# Patient Record
Sex: Male | Born: 1975 | Race: Black or African American | Hispanic: No | Marital: Single | Smoking: Never smoker
Health system: Southern US, Community
[De-identification: ages and names within clinical notes are randomized; demographics above are authoritative.]

## PROBLEM LIST (undated history)

## (undated) DIAGNOSIS — N400 Enlarged prostate without lower urinary tract symptoms: Secondary | ICD-10-CM

## (undated) DIAGNOSIS — R0600 Dyspnea, unspecified: Secondary | ICD-10-CM

## (undated) DIAGNOSIS — K219 Gastro-esophageal reflux disease without esophagitis: Secondary | ICD-10-CM

## (undated) DIAGNOSIS — I429 Cardiomyopathy, unspecified: Secondary | ICD-10-CM

## (undated) HISTORY — DX: Benign prostatic hyperplasia without lower urinary tract symptoms: N40.0

## (undated) HISTORY — PX: CARPAL TUNNEL RELEASE: SHX101

---

## 2006-02-18 HISTORY — PX: TONSILLECTOMY AND ADENOIDECTOMY: SUR1326

## 2006-07-14 ENCOUNTER — Ambulatory Visit: Payer: Self-pay | Admitting: Otolaryngology

## 2009-11-09 ENCOUNTER — Ambulatory Visit: Payer: Self-pay | Admitting: Otolaryngology

## 2009-11-27 ENCOUNTER — Ambulatory Visit: Payer: Self-pay | Admitting: Otolaryngology

## 2010-07-12 ENCOUNTER — Ambulatory Visit: Payer: Self-pay | Admitting: Unknown Physician Specialty

## 2012-05-25 ENCOUNTER — Ambulatory Visit (INDEPENDENT_AMBULATORY_CARE_PROVIDER_SITE_OTHER): Payer: BC Managed Care – PPO | Admitting: Internal Medicine

## 2012-05-25 ENCOUNTER — Encounter: Payer: Self-pay | Admitting: Internal Medicine

## 2012-05-25 VITALS — BP 120/82 | HR 48 | Temp 98.0°F | Ht 66.0 in | Wt 213.0 lb

## 2012-05-25 DIAGNOSIS — Z0000 Encounter for general adult medical examination without abnormal findings: Secondary | ICD-10-CM

## 2012-05-25 DIAGNOSIS — N400 Enlarged prostate without lower urinary tract symptoms: Secondary | ICD-10-CM

## 2012-05-25 DIAGNOSIS — R209 Unspecified disturbances of skin sensation: Secondary | ICD-10-CM

## 2012-05-25 DIAGNOSIS — R002 Palpitations: Secondary | ICD-10-CM

## 2012-05-25 DIAGNOSIS — R200 Anesthesia of skin: Secondary | ICD-10-CM

## 2012-05-25 LAB — LIPID PANEL
Cholesterol: 170 mg/dL (ref 0–200)
HDL: 38.8 mg/dL — ABNORMAL LOW (ref >39.00)
LDL Cholesterol: 119 mg/dL — ABNORMAL HIGH (ref 0–99)
Total CHOL/HDL Ratio: 4
Triglycerides: 59 mg/dL (ref 0.0–149.0)
VLDL: 11.8 mg/dL (ref 0.0–40.0)

## 2012-05-25 LAB — CBC WITH DIFFERENTIAL/PLATELET
Basophils Absolute: 0 10*3/uL (ref 0.0–0.1)
Basophils Relative: 0.2 % (ref 0.0–3.0)
Eosinophils Absolute: 0.3 10*3/uL (ref 0.0–0.7)
Eosinophils Relative: 3.8 % (ref 0.0–5.0)
HCT: 42.9 % (ref 39.0–52.0)
Hemoglobin: 14.5 g/dL (ref 13.0–17.0)
Lymphocytes Relative: 27.9 % (ref 12.0–46.0)
Lymphs Abs: 2.5 10*3/uL (ref 0.7–4.0)
MCHC: 33.8 g/dL (ref 30.0–36.0)
MCV: 84.7 fl (ref 78.0–100.0)
Monocytes Absolute: 0.5 10*3/uL (ref 0.1–1.0)
Monocytes Relative: 5.6 % (ref 3.0–12.0)
Neutro Abs: 5.7 10*3/uL (ref 1.4–7.7)
Neutrophils Relative %: 62.5 % (ref 43.0–77.0)
Platelets: 219 10*3/uL (ref 150.0–400.0)
RBC: 5.07 Mil/uL (ref 4.22–5.81)
RDW: 12.9 % (ref 11.5–14.6)
WBC: 9 10*3/uL (ref 4.5–10.5)

## 2012-05-25 LAB — COMPREHENSIVE METABOLIC PANEL
ALT: 33 U/L (ref 0–53)
AST: 20 U/L (ref 0–37)
Albumin: 3.9 g/dL (ref 3.5–5.2)
Alkaline Phosphatase: 45 U/L (ref 39–117)
BUN: 11 mg/dL (ref 6–23)
CO2: 30 mEq/L (ref 19–32)
Calcium: 9 mg/dL (ref 8.4–10.5)
Chloride: 105 mEq/L (ref 96–112)
Creatinine, Ser: 1 mg/dL (ref 0.4–1.5)
GFR: 105.61 mL/min (ref >60.00)
Glucose, Bld: 89 mg/dL (ref 70–99)
Potassium: 4.1 mEq/L (ref 3.5–5.1)
Sodium: 142 mEq/L (ref 135–145)
Total Bilirubin: 0.7 mg/dL (ref 0.3–1.2)
Total Protein: 7.1 g/dL (ref 6.0–8.3)

## 2012-05-25 LAB — TSH: TSH: 3.01 u[IU]/mL (ref 0.35–5.50)

## 2012-05-25 MED ORDER — SILODOSIN 4 MG PO CAPS: 8.0000 mg | ORAL_CAPSULE | Freq: Every day | ORAL | Status: DC

## 2012-05-25 NOTE — Assessment & Plan Note (Addendum)
Bilateral arm numbness, generally radiating from hands up to shoulders. Exam is normal today. Previous Dx of carpal tunnel s/p median nerve release right hand.  However, now having bilateral symptoms. Question if he may have cervical radiculopathy causing symptoms. Insurance will not allow for MRI cervical spine without course of physical therapy or neurology evaluation. Will set up neurology evaluation. EMG testing would be helpful to distinguish source of numbness.

## 2012-05-25 NOTE — Assessment & Plan Note (Signed)
H/o enlarged prostate. Will request notes from urology. Will check PSA today. Continue Rapaflo prn.

## 2012-05-25 NOTE — Assessment & Plan Note (Signed)
Occasional palpitations. No symptoms to suggest cardiac ischemia. EKG shows bradycardia. Electrolytes, thyroid function normal on labs. Will continue to monitor. Pt will call if symptoms are worsening.

## 2012-05-25 NOTE — Progress Notes (Signed)
Subjective:    Patient ID: Gary Pace, male    DOB: Jun 07, 1975, 37 y.o.   MRN: [id number]  HPI 37 year old male with enlarged prostate and bilateral carpal tunnel syndrome presents to establish care. Patient is concerned about numbness in both of his hands. This is been ongoing for years. He was previously diagnosed with carpal tunnel and underwent median nerve release in his right hand. He had some improvement with this however over the last couple of months symptoms of numbness in both of his hands has worsened. He now has bilateral hand numbness which radiates up his arms and shoulders. This is made worse by extending his arms over his head. It is not generally affected at night. He denies pain in his neck or upper back. He denies any history of trauma to his neck or upper back. He denies weakness in his arms.  He is also concerned about occasional palpitations. These are described as a "fluttering" which is not associated with any chest pain, shortness of breath, diaphoresis. He is physically very active in his job and denies any shortness of breath or chest pain with exertion. These episodes have been fleeting and rare. It lasts for a few seconds and then resolve without any intervention.  He notes a history of enlarged prostate. He occasionally takes Rapaflo with improvement in UOP. He is followed by urology. He reports that previous PSA testing was normal. He has had a history of prostatitis in the past. No current symptoms of dysuria, pain.  Outpatient Encounter Prescriptions as of 05/25/2012  Medication Sig Dispense Refill  . silodosin (RAPAFLO) 4 MG CAPS capsule Take 2 capsules (8 mg total) by mouth daily with breakfast.  60 capsule  1   No facility-administered encounter medications on file as of 05/25/2012.   BP 120/82  Pulse 48  Temp(Src) 98 F (36.7 C) (Oral)  Ht 5\' 6"  (1.676 m)  Wt 213 lb (96.616 kg)  BMI 34.4 kg/m2  SpO2 97%  Review of Systems  Constitutional: Negative for  fever, chills, activity change, appetite change, fatigue and unexpected weight change.  HENT: Positive for congestion (occasional). Negative for sneezing, neck pain, neck stiffness, postnasal drip and sinus pressure.   Eyes: Negative for visual disturbance.  Respiratory: Negative for cough and shortness of breath.   Cardiovascular: Positive for palpitations. Negative for chest pain and leg swelling.  Gastrointestinal: Negative for abdominal pain and abdominal distention.  Genitourinary: Negative for dysuria, urgency and difficulty urinating.  Musculoskeletal: Negative for back pain, arthralgias and gait problem.  Skin: Negative for color change and rash.  Neurological: Positive for numbness. Negative for dizziness, weakness and headaches.  Hematological: Negative for adenopathy.  Psychiatric/Behavioral: Negative for sleep disturbance and dysphoric mood. The patient is not nervous/anxious.        Objective:   Physical Exam  Constitutional: He is oriented to person, place, and time. He appears well-developed and well-nourished. No distress.  HENT:  Head: Normocephalic and atraumatic.  Right Ear: External ear normal.  Left Ear: External ear normal.  Nose: Nose normal.  Mouth/Throat: Oropharynx is clear and moist. No oropharyngeal exudate.  Eyes: Conjunctivae and EOM are normal. Pupils are equal, round, and reactive to light. Right eye exhibits no discharge. Left eye exhibits no discharge. No scleral icterus.  Neck: Normal range of motion. Neck supple. No tracheal deviation present. No thyromegaly present.  Cardiovascular: Normal rate, regular rhythm and normal heart sounds.  Exam reveals no gallop and no friction rub.   No murmur heard.  Pulmonary/Chest: Effort normal and breath sounds normal. No accessory muscle usage. Not tachypneic. No respiratory distress. He has no decreased breath sounds. He has no wheezes. He has no rhonchi. He has no rales. He exhibits no tenderness.  Abdominal:  Soft. Bowel sounds are normal. He exhibits no distension and no mass. There is no tenderness. There is no rebound and no guarding.  Musculoskeletal: Normal range of motion. He exhibits no edema.  Lymphadenopathy:    He has no cervical adenopathy.  Neurological: He is alert and oriented to person, place, and time. He displays no atrophy and no tremor. No cranial nerve deficit or sensory deficit. He exhibits normal muscle tone. Coordination and gait normal.  Reflex Scores:      Patellar reflexes are 2+ on the right side and 2+ on the left side. Skin: Skin is warm and dry. No rash noted. He is not diaphoretic. No erythema. No pallor.  Psychiatric: He has a normal mood and affect. His behavior is normal. Judgment and thought content normal.          Assessment & Plan:

## 2012-05-26 ENCOUNTER — Encounter: Payer: Self-pay | Admitting: *Deleted

## 2012-05-26 ENCOUNTER — Encounter: Payer: Self-pay | Admitting: Emergency Medicine

## 2012-05-26 LAB — PSA, TOTAL AND FREE
PSA, Free Pct: 43 % (ref >25)
PSA, Free: 0.1 ng/mL
PSA: 0.23 ng/mL (ref <=4.00)

## 2012-05-29 ENCOUNTER — Telehealth: Payer: Self-pay | Admitting: Internal Medicine

## 2012-05-29 NOTE — Telephone Encounter (Signed)
Asking for results °

## 2012-05-29 NOTE — Telephone Encounter (Signed)
Asking that you call Jan Mehring at work:  [phone].

## 2012-06-01 NOTE — Telephone Encounter (Signed)
Spoke with patient on Friday, he called before he received letter in mail. Result letter was mailed and was received on Friday.

## 2012-06-29 ENCOUNTER — Ambulatory Visit: Payer: BC Managed Care – PPO | Admitting: Internal Medicine

## 2013-04-22 ENCOUNTER — Encounter: Payer: Self-pay | Admitting: Adult Health

## 2013-04-22 ENCOUNTER — Ambulatory Visit (INDEPENDENT_AMBULATORY_CARE_PROVIDER_SITE_OTHER): Payer: BC Managed Care – PPO | Admitting: Adult Health

## 2013-04-22 VITALS — BP 108/66 | HR 64 | Temp 98.1°F | Resp 16 | Wt 226.0 lb

## 2013-04-22 DIAGNOSIS — J019 Acute sinusitis, unspecified: Secondary | ICD-10-CM

## 2013-04-22 MED ORDER — AMOXICILLIN-POT CLAVULANATE 875-125 MG PO TABS: 1.0000 | ORAL_TABLET | Freq: Two times a day (BID) | ORAL | Status: DC

## 2013-04-22 NOTE — Patient Instructions (Signed)
  Start Augmentin 1 tablet twice a day for 7 days.  Use Dymista nasal spray - 1 spray into each nostril twice daily  You may irrigate sinuses once daily with either a nettie pot of simple saline.  Cool mist humidifier will also help get sinuses moist and prevent from over drying.  Please call if no improvement in your symptoms within 4-5 days.

## 2013-04-22 NOTE — Progress Notes (Signed)
Pre visit review using our clinic review tool, if applicable. No additional management support is needed unless otherwise documented below in the visit note. 

## 2013-04-22 NOTE — Progress Notes (Signed)
   Subjective:    Patient ID: Gary Pace, male    DOB: [birth year], 38 y.o.   MRN: [id number]  HPI  Pt is a pleasant 38 y/o male who presents to clinic with > 2 week symptoms of sinus pressure, HA, yellow secretions, post nasal drip. Denies cough, sob, fever or chills. He has tried OTC Dayquil and/or Nyquil without noticeable improvement.  Current Outpatient Prescriptions on File Prior to Visit  Medication Sig Dispense Refill  . silodosin (RAPAFLO) 4 MG CAPS capsule Take 2 capsules (8 mg total) by mouth daily with breakfast.  60 capsule  1   No current facility-administered medications on file prior to visit.    Review of Systems  Constitutional: Negative for fever and chills.  HENT: Positive for postnasal drip and sinus pressure.   Respiratory: Negative.   Cardiovascular: Negative.   Neurological: Positive for headaches. Negative for dizziness.       Objective:   Physical Exam  Constitutional: He is oriented to person, place, and time. He appears well-developed and well-nourished. No distress.  HENT:  Nose: Nose normal.  Pharyngeal erythema  Cardiovascular: Normal rate, regular rhythm and normal heart sounds.   Pulmonary/Chest: Effort normal and breath sounds normal. No respiratory distress. He has no wheezes. He has no rales.  Musculoskeletal: Normal range of motion.  Lymphadenopathy:    He has no cervical adenopathy.  Neurological: He is alert and oriented to person, place, and time.  Psychiatric: He has a normal mood and affect. His behavior is normal. Judgment and thought content normal.       Assessment & Plan:    1. Acute sinusitis with symptoms > 10 days Irrigate sinus qd. Dymista spray. Cool mist humidifier. Start Augmentin bid x 7 days

## 2013-09-17 ENCOUNTER — Encounter: Payer: Self-pay | Admitting: Adult Health

## 2013-09-17 ENCOUNTER — Ambulatory Visit (INDEPENDENT_AMBULATORY_CARE_PROVIDER_SITE_OTHER): Payer: BC Managed Care – PPO | Admitting: Adult Health

## 2013-09-17 VITALS — BP 121/80 | HR 53 | Temp 98.0°F | Resp 14 | Wt 219.0 lb

## 2013-09-17 DIAGNOSIS — J019 Acute sinusitis, unspecified: Secondary | ICD-10-CM

## 2013-09-17 MED ORDER — AMOXICILLIN-POT CLAVULANATE 875-125 MG PO TABS: 1.0000 | ORAL_TABLET | Freq: Two times a day (BID) | ORAL | Status: DC

## 2013-09-17 NOTE — Progress Notes (Signed)
Patient ID: Gary HalJames B Bermea, male   DOB: [birth year], 38 y.o.   MRN: [id number]   Subjective:    Patient ID: Gary Pace, male    DOB: [birth year], 38 y.o.   MRN: [id number]  HPI  Patient is a pleasant 38 year old male who presents to clinic with sinus congestion, postnasal drip, cough that has been ongoing for 2-3 weeks. He has been using Flonase and taking Claritin D. occasionally. He reports that his symptoms have not improved. Denies fever, chills. He is having mild frontal headaches.  Past Medical History  Diagnosis Date  . Enlarged prostate     Followed by Dr. Achilles Dunkope    No current outpatient prescriptions on file prior to visit.   No current facility-administered medications on file prior to visit.     Review of Systems  Constitutional: Negative for fever and chills.  HENT: Positive for congestion, postnasal drip and sinus pressure. Negative for ear discharge, rhinorrhea and sore throat.   Respiratory: Positive for cough. Negative for shortness of breath and wheezing.   Gastrointestinal: Negative.   Neurological: Positive for headaches.  All other systems reviewed and are negative.      Objective:  BP 121/80  Pulse 53  Temp(Src) 98 F (36.7 C) (Oral)  Resp 14  Wt 219 lb (99.338 kg)  SpO2 97%   Physical Exam  Constitutional: He is oriented to person, place, and time. He appears well-developed and well-nourished. No distress.  HENT:  Head: Normocephalic and atraumatic.  Mouth/Throat: No oropharyngeal exudate.  Eyes: Conjunctivae and EOM are normal.  Neck: Normal range of motion. Neck supple.  Cardiovascular: Normal rate, regular rhythm and normal heart sounds.  Exam reveals no gallop and no friction rub.   No murmur heard. Pulmonary/Chest: Effort normal and breath sounds normal. No respiratory distress. He has no wheezes. He has no rales.  Musculoskeletal: Normal range of motion.  Lymphadenopathy:    He has no cervical adenopathy.  Neurological: He is alert and  oriented to person, place, and time.  Skin: Skin is warm and dry.  Psychiatric: He has a normal mood and affect. His behavior is normal. Judgment and thought content normal.      Assessment & Plan:   1. Acute sinusitis with symptoms > 10 days Start Augmentin twice a day x7 days. Return to clinic if symptoms are not improved within 4-5 days.

## 2013-09-17 NOTE — Patient Instructions (Signed)
  Start Augmentin 1 tablet twice a day for 7 days.  Continue antihistamine such as Claritin, Allegra or Zyrtec.  Drink plenty of fluids.  Tylenol for general discomfort or fever.  Return to clinic if symptoms do not improve within 4-5 days.

## 2013-09-17 NOTE — Progress Notes (Signed)
Pre visit review using our clinic review tool, if applicable. No additional management support is needed unless otherwise documented below in the visit note. 

## 2014-07-11 ENCOUNTER — Encounter: Payer: Self-pay | Admitting: Internal Medicine

## 2014-07-11 ENCOUNTER — Ambulatory Visit (INDEPENDENT_AMBULATORY_CARE_PROVIDER_SITE_OTHER): Payer: BLUE CROSS/BLUE SHIELD | Admitting: Internal Medicine

## 2014-07-11 VITALS — BP 110/78 | HR 52 | Temp 98.4°F | Resp 16 | Ht 66.0 in | Wt 214.2 lb

## 2014-07-11 DIAGNOSIS — J0120 Acute ethmoidal sinusitis, unspecified: Secondary | ICD-10-CM | POA: Diagnosis not present

## 2014-07-11 DIAGNOSIS — J019 Acute sinusitis, unspecified: Secondary | ICD-10-CM | POA: Diagnosis not present

## 2014-07-11 MED ORDER — BENZONATATE 200 MG PO CAPS: 200.0000 mg | ORAL_CAPSULE | Freq: Three times a day (TID) | ORAL | Status: DC | PRN

## 2014-07-11 MED ORDER — PREDNISONE 10 MG PO TABS: ORAL_TABLET | ORAL | Status: DC

## 2014-07-11 MED ORDER — AMOXICILLIN-POT CLAVULANATE 875-125 MG PO TABS: 1.0000 | ORAL_TABLET | Freq: Two times a day (BID) | ORAL | Status: DC

## 2014-07-11 NOTE — Progress Notes (Signed)
Subjective:  Patient ID: Gary Pace, male    DOB: 01/01/1976  Age: 39 y.o. MRN: [id number]  CC: The primary encounter diagnosis was Acute sinusitis with symptoms > 10 days. A diagnosis of Acute ethmoidal sinusitis, recurrence not specified was also pertinent to this visit.  HPI Gary Pace presents for sinus congestion purulent drainage x 4 weeks,  Headaches,  Cough.  Has been using over the counter medications without significant change.   Outpatient Prescriptions Prior to Visit  Medication Sig Dispense Refill  . silodosin (RAPAFLO) 4 MG CAPS capsule Take 8 mg by mouth as needed.    Marland Kitchen amoxicillin-clavulanate (AUGMENTIN) 875-125 MG per tablet Take 1 tablet by mouth 2 (two) times daily. (Patient not taking: Reported on 07/11/2014) 14 tablet 0   No facility-administered medications prior to visit.    Review of Systems;  Patient denies headache, fevers, malaise, unintentional weight loss, skin rash, eye pain,  pain, sore throat, dysphagia,  hemoptysis ,  dyspnea, wheezing, chest pain, palpitations, orthopnea, edema, abdominal pain, nausea, melena, diarrhea, constipation, flank pain, dysuria, hematuria, urinary  Frequency, nocturia, numbness, tingling, seizures,  Focal weakness, Loss of consciousness,  Tremor, insomnia, depression, anxiety, and suicidal ideation.      Objective:  BP 110/78 mmHg  Pulse 52  Temp(Src) 98.4 F (36.9 C) (Oral)  Resp 16  Ht  (1.676 m)  Wt 214 lb 4 oz (97.183 kg)  BMI 34.60 kg/m2  SpO2 97%  BP Readings from Last 3 Encounters:  07/11/14 110/78  09/17/13 121/80  04/22/13 108/66    Wt Readings from Last 3 Encounters:  07/11/14 214 lb 4 oz (97.183 kg)  09/17/13 219 lb (99.338 kg)  04/22/13 226 lb (102.513 kg)    General appearance: alert, cooperative and appears stated age Ears: normal TM's and external ear canals both ears Throat: lips, mucosa, and tongue normal; teeth and gums normal Neck: no adenopathy, no carotid bruit, supple,  symmetrical, trachea midline and thyroid not enlarged, symmetric, no tenderness/mass/nodules Lungs: clear to auscultation bilaterally Heart: regular rate and rhythm, S1, S2 normal, no murmur, click, rub or gallop Lymph nodes: Cervical, supraclavicular, and axillary nodes normal.   No results found.  Assessment & Plan:   Problem List Items Addressed This Visit    Acute sinusitis with symptoms > 10 days - Primary    Prednisone taper, augmentin, Probiotic, sinus rinse bid,  claritin and sudafed       Relevant Medications   amoxicillin-clavulanate (AUGMENTIN) 875-125 MG per tablet   predniSONE (DELTASONE) 10 MG tablet   benzonatate (TESSALON) 200 MG capsule   Sinusitis, acute   Relevant Medications   amoxicillin-clavulanate (AUGMENTIN) 875-125 MG per tablet   predniSONE (DELTASONE) 10 MG tablet   benzonatate (TESSALON) 200 MG capsule      I am having [name] start on predniSONE and benzonatate. I am also having him maintain his silodosin and amoxicillin-clavulanate.  Meds ordered this encounter  Medications  . amoxicillin-clavulanate (AUGMENTIN) 875-125 MG per tablet    Sig: Take 1 tablet by mouth 2 (two) times daily.    Dispense:  14 tablet    Refill:  0  . predniSONE (DELTASONE) 10 MG tablet    Sig: 6 tablets on Day 1 , then reduce by 1 tablet daily until gone    Dispense:  21 tablet    Refill:  0  . benzonatate (TESSALON) 200 MG capsule    Sig: Take 1 capsule (200 mg total) by mouth 3 (three) times  daily as needed for cough.    Dispense:  60 capsule    Refill:  1    Medications Discontinued During This Encounter  Medication Reason  . amoxicillin-clavulanate (AUGMENTIN) 875-125 MG per tablet Reorder    Follow-up: No Follow-up on file.   Sherlene ShamsULLO, Roshaunda Starkey L, MD

## 2014-07-11 NOTE — Assessment & Plan Note (Addendum)
Prednisone taper, augmentin, Probiotic, sinus rinse bid,  claritin and sudafed

## 2014-07-11 NOTE — Patient Instructions (Addendum)
I am treating you for sinusitis/otitis which is a complication from  persistent sinus congestion.   I am prescribing an antibiotic (augmentin) and a prednisone taper  To manage the infection and the inflammation in your ear/sinuses.   The prednisone tablets should be taken as follows:  6 tablets all at once on Day 1.  Then 5 tablets all at once on Day 2,  Then 4 tablets on day 3, etc   I also advise use of the following OTC meds to help with your other symptoms.   Take generic OTC benadryl 25 mg every 8 hours for the drainage,  Sudafed PE  10 to 30 mg every 8 hours for the congestion, you may substitute Afrin nasal spray for the nighttime dose of sudafed PE  If needed to prevent insomnia.  flush your sinuses twice daily with NeilMed sinus rinse (do over the sink because if you do it right you will spit out globs of mucus)  I recommend continuing claritin and sinus rinse for the rest of the summer  You can add the cough capsules 3 times daily if needed,  If Delsym is not strong enough

## 2014-07-11 NOTE — Progress Notes (Signed)
Pre-visit discussion using our clinic review tool. No additional management support is needed unless otherwise documented below in the visit note.  

## 2014-07-12 DIAGNOSIS — J019 Acute sinusitis, unspecified: Secondary | ICD-10-CM | POA: Insufficient documentation

## 2014-10-27 ENCOUNTER — Ambulatory Visit (INDEPENDENT_AMBULATORY_CARE_PROVIDER_SITE_OTHER): Payer: BLUE CROSS/BLUE SHIELD | Admitting: Family Medicine

## 2014-10-27 ENCOUNTER — Encounter: Payer: Self-pay | Admitting: Family Medicine

## 2014-10-27 ENCOUNTER — Telehealth: Payer: Self-pay | Admitting: Internal Medicine

## 2014-10-27 VITALS — BP 122/74 | HR 69 | Temp 98.8°F | Ht 66.0 in | Wt 218.1 lb

## 2014-10-27 DIAGNOSIS — B349 Viral infection, unspecified: Secondary | ICD-10-CM | POA: Diagnosis not present

## 2014-10-27 NOTE — Progress Notes (Signed)
Pre visit review using our clinic review tool, if applicable. No additional management support is needed unless otherwise documented below in the visit note. 

## 2014-10-27 NOTE — Progress Notes (Signed)
   Subjective:  Patient ID: Gary Pace, male    DOB: 28-Aug-1975  Age: 39 y.o. MRN: [id number]  CC: Fatigue, chills, low grade temp  HPI:  39 year old male presents to the clinic today for evaluation of the above symptoms.  Patient reports that he went to the hospital to see a family member yesterday.  He reports that this morning he had significant fatigue and "just couldn't get going". He reports that he also had a low-grade fever temperature of 100.2.  He reports some sinus congestion.  He has had chills as well.  He denies any associated myalgias. No known sick contacts. Patient took some Aleve and has some relief following. No exacerbating factors.  Social Hx   Social History   Social History  . Marital Status: Single    Spouse Name: N/A  . Number of Children: N/A  . Years of Education: N/A   Social History Main Topics  . Smoking status: Never Smoker   . Smokeless tobacco: Never Used  . Alcohol Use: No  . Drug Use: No  . Sexual Activity: Not Asked   Other Topics Concern  . None   Social History Narrative   Lives in [place]. 2 dogs.      Work - [employer], Tour manager      Diet - regular      Exercise - none    Review of Systems  Constitutional: Positive for fever and chills.  HENT: Positive for congestion.     Objective:  BP 122/74 mmHg  Pulse 69  Temp(Src) 98.8 F (37.1 C) (Oral)  Ht  (1.676 m)  Wt 218 lb 2 oz (98.941 kg)  BMI 35.22 kg/m2  SpO2 96%  BP/Weight 10/27/2014 07/11/2014 09/17/2013  Systolic BP 122 110 121  Diastolic BP 74 78 80  Wt. (Lbs) 218.13 214.25 219  BMI 35.22 34.6 35.36   Physical Exam  Constitutional: He appears well-developed and well-nourished. No distress.  HENT:  Head: Normocephalic and atraumatic.  Right Ear: External ear normal.  Left Ear: External ear normal.  Mouth/Throat: No oropharyngeal exudate.  Cobblestoning noted.  Neck: Neck supple.  Cardiovascular: Normal rate and regular rhythm.   No murmur  heard. Pulmonary/Chest: Effort normal and breath sounds normal. No respiratory distress. He has no wheezes. He has no rales.  Lymphadenopathy:    He has no cervical adenopathy.  Vitals reviewed.   Lab Results  Component Value Date   WBC 9.0 05/25/2012   HGB 14.5 05/25/2012   HCT 42.9 05/25/2012   PLT 219.0 05/25/2012   GLUCOSE 89 05/25/2012   CHOL 170 05/25/2012   TRIG 59.0 05/25/2012   HDL 38.80* 05/25/2012   LDLCALC 119* 05/25/2012   ALT 33 05/25/2012   AST 20 05/25/2012   NA 142 05/25/2012   K 4.1 05/25/2012   CL 105 05/25/2012   CREATININE 1.0 05/25/2012   BUN 11 05/25/2012   CO2 30 05/25/2012   TSH 3.01 05/25/2012   PSA 0.23 05/25/2012    Assessment & Plan:   Problem List Items Addressed This Visit    Viral illness - Primary    New problem. Exam nonfocal. Rapid flu negative.  Supportive care with PRN follow up if worsens.      Relevant Orders   POCT Influenza A/B      Follow-up: PRN  Everlene Other, DO

## 2014-10-27 NOTE — Telephone Encounter (Signed)
Please call pt and schedule an appointment with cook/[place]

## 2014-10-27 NOTE — Telephone Encounter (Signed)
[facility] [place] TELEPHONE ADVICE RECORD   [facility]     Patient Name: Gary Pace Initial Comment Caller states he is having sinus headaches with dizziness  DOB: 22-Jun-1975      Nurse Assessment  Nurse: Roderic Ovens, RN, Amy Date/Time Lamount Cohen Time): 10/27/2014 11:25:22 AM  Confirm and document reason for call. If symptomatic, describe symptoms. ---HEADACHE STARTED THIS MORNING. HE TOOK SOME ADVIL. HE IS HAVING SINUS CONGESTION. HE IS NOT HAVING ANY FEVER.   Has the patient traveled out of the country within the last 30 days? ---Not Applicable   Does the patient require triage? ---Yes   Related visit to physician within the last 2 weeks? ---No   Does the PT have any chronic conditions? (i.e. diabetes, asthma, etc.) ---No     Guidelines     Guideline Title Affirmed Question Affirmed Notes   Headache [1] SEVERE headache (e.g., excruciating) AND [2] not improved after 2 hours of pain medicine    Final Disposition User   See Physician within 4 Hours (or PCP triage) Roderic Ovens, RN, Amy     Referrals   REFERRED TO PCP OFFICE   Disagree/Comply: Comply

## 2014-10-27 NOTE — Assessment & Plan Note (Signed)
New problem. Exam nonfocal. Rapid flu negative.  Supportive care with PRN follow up if worsens.

## 2014-10-27 NOTE — Patient Instructions (Signed)
It was nice to see you today.  This is likely viral.  Let us know if you fail to improve.  Take care  Dr. Adriana Simas  Viral Infections A virus is a type of germ. Viruses can cause:  Minor sore throats.  Aches and pains.  Headaches.  Runny nose.  Rashes.  Watery eyes.  Tiredness.  Coughs.  Loss of appetite.  Feeling sick to your stomach (nausea).  Throwing up (vomiting).  Watery poop (diarrhea). HOME CARE   Only take medicines as told by your doctor.  Drink enough water and fluids to keep your pee (urine) clear or pale yellow. Sports drinks are a good choice.  Get plenty of rest and eat healthy. Soups and broths with crackers or rice are fine. GET HELP RIGHT AWAY IF:   You have a very bad headache.  You have shortness of breath.  You have chest pain or neck pain.  You have an unusual rash.  You cannot stop throwing up.  You have watery poop that does not stop.  You cannot keep fluids down.  You or your child has a temperature by mouth above 102 F (38.9 C), not controlled by medicine.  Your baby is older than 3 months with a rectal temperature of 102 F (38.9 C) or higher.  Your baby is 73 months old or younger with a rectal temperature of 100.4 F (38 C) or higher. MAKE SURE YOU:   Understand these instructions.  Will watch this condition.  Will get help right away if you are not doing well or get worse. Document Released: 01/18/2008 Document Revised: 04/29/2011 Document Reviewed: 06/12/2010 [facility] Patient Information 2015 [place], [place]. This information is not intended to replace advice given to you by your health care provider. Make sure you discuss any questions you have with your health care provider.

## 2015-06-30 ENCOUNTER — Encounter: Payer: Self-pay | Admitting: Family Medicine

## 2015-06-30 ENCOUNTER — Ambulatory Visit (INDEPENDENT_AMBULATORY_CARE_PROVIDER_SITE_OTHER): Payer: 59 | Admitting: Family Medicine

## 2015-06-30 VITALS — BP 122/78 | HR 49 | Temp 98.2°F | Wt 227.5 lb

## 2015-06-30 DIAGNOSIS — J302 Other seasonal allergic rhinitis: Secondary | ICD-10-CM

## 2015-06-30 NOTE — Patient Instructions (Signed)
It was nice to see you today.  Use OTC Flonase as well as Xyzal. You can pick them up at her local pharmacy.  If you worsen please let us know.  Follow up:  As scheduled for your physical.  Take care  Dr. Adriana Simasook

## 2015-06-30 NOTE — Progress Notes (Signed)
Pre visit review using our clinic review tool, if applicable. No additional management support is needed unless otherwise documented below in the visit note. 

## 2015-07-02 DIAGNOSIS — J302 Other seasonal allergic rhinitis: Secondary | ICD-10-CM | POA: Insufficient documentation

## 2015-07-02 NOTE — Progress Notes (Signed)
Subjective:  Patient ID: Gary Pace, male    DOB: 1975/09/19  Age: 40 y.o. MRN: [id number]  CC: Sinus issues  HPI:  40 year old male presents with complaints of sinus congestion & cough.  Patient reports a 3 week history of sinus congestion and cough. He denies any purulent nasal discharge. He states that the cough is from postnasal drip. No associated fevers or chills. He's been using over-the-counter Flonase and Claritin with some improvement. Patient states that he mows lawns as a side job and this is likely an exacerbating factor. No other complaints at this time.  Social Hx   Social History   Social History  . Marital Status: Single    Spouse Name: N/A  . Number of Children: N/A  . Years of Education: N/A   Social History Main Topics  . Smoking status: Never Smoker   . Smokeless tobacco: Never Used  . Alcohol Use: No  . Drug Use: No  . Sexual Activity: Not Asked   Other Topics Concern  . None   Social History Narrative   [place] 2 dogs.      Work - [employer], Tour managerradiator      Diet - regular      Exercise - none   Review of Systems  Constitutional: Negative.   HENT: Positive for postnasal drip and sinus pressure.   Respiratory: Positive for cough.    Objective:  BP 122/78 mmHg  Pulse 49  Temp(Src) 98.2 F (36.8 C) (Oral)  Wt 227 lb 8 oz (103.193 kg)  SpO2 95%  BP/Weight 06/30/2015 10/27/2014 07/11/2014  Systolic BP 122 122 110  Diastolic BP 78 74 78  Wt. (Lbs) 227.5 218.13 214.25  BMI 36.74 35.22 34.6   Physical Exam  Constitutional: He is oriented to person, place, and time. He appears well-developed. No distress.  HENT:  Head: Normocephalic and atraumatic.  Mouth/Throat: Oropharynx is clear and moist.  Enlarged turbinates noted.  Eyes: Conjunctivae are normal.  Cardiovascular: Normal rate and regular rhythm.   Pulmonary/Chest: Effort normal. He has no wheezes. He has no rales.  Neurological: He is alert and oriented to person, place, and time.   Psychiatric: He has a normal mood and affect.  Vitals reviewed.   Lab Results  Component Value Date   WBC 9.0 05/25/2012   HGB 14.5 05/25/2012   HCT 42.9 05/25/2012   PLT 219.0 05/25/2012   GLUCOSE 89 05/25/2012   CHOL 170 05/25/2012   TRIG 59.0 05/25/2012   HDL 38.80* 05/25/2012   LDLCALC 119* 05/25/2012   ALT 33 05/25/2012   AST 20 05/25/2012   NA 142 05/25/2012   K 4.1 05/25/2012   CL 105 05/25/2012   CREATININE 1.0 05/25/2012   BUN 11 05/25/2012   CO2 30 05/25/2012   TSH 3.01 05/25/2012   PSA 0.23 05/25/2012   Assessment & Plan:   Problem List Items Addressed This Visit    Seasonal allergies - Primary    New problem. No evidence of infection at this time. Symptoms are secondary to allergies. Treating with xyzal and flonase.         Meds ordered this encounter  Medications  . fluticasone (FLONASE) 50 MCG/ACT nasal spray    Sig: Place into both nostrils daily as needed for allergies or rhinitis.  Marland Kitchen. loratadine (CLARITIN) 10 MG tablet    Sig: Take 10 mg by mouth daily as needed for allergies.    Follow-up: Has upcoming annual physical with PCP.  [place]

## 2015-07-02 NOTE — Assessment & Plan Note (Signed)
New problem. No evidence of infection at this time. Symptoms are secondary to allergies. Treating with xyzal and flonase.

## 2015-07-24 ENCOUNTER — Ambulatory Visit (INDEPENDENT_AMBULATORY_CARE_PROVIDER_SITE_OTHER): Payer: 59 | Admitting: Internal Medicine

## 2015-07-24 ENCOUNTER — Encounter: Payer: Self-pay | Admitting: Internal Medicine

## 2015-07-24 VITALS — BP 98/68 | HR 52 | Ht 66.0 in | Wt 222.4 lb

## 2015-07-24 DIAGNOSIS — R1310 Dysphagia, unspecified: Secondary | ICD-10-CM | POA: Diagnosis not present

## 2015-07-24 DIAGNOSIS — B372 Candidiasis of skin and nail: Secondary | ICD-10-CM | POA: Diagnosis not present

## 2015-07-24 DIAGNOSIS — Z0000 Encounter for general adult medical examination without abnormal findings: Secondary | ICD-10-CM | POA: Diagnosis not present

## 2015-07-24 DIAGNOSIS — R208 Other disturbances of skin sensation: Secondary | ICD-10-CM | POA: Diagnosis not present

## 2015-07-24 DIAGNOSIS — R200 Anesthesia of skin: Secondary | ICD-10-CM

## 2015-07-24 DIAGNOSIS — Z0001 Encounter for general adult medical examination with abnormal findings: Secondary | ICD-10-CM

## 2015-07-24 MED ORDER — NYSTATIN 100000 UNIT/GM EX POWD: Freq: Four times a day (QID) | CUTANEOUS | Status: DC

## 2015-07-24 NOTE — Patient Instructions (Signed)

## 2015-07-24 NOTE — Assessment & Plan Note (Signed)
Dysphagia intermittently with dry, solid foods. Discussed GI evaluation, however given recent issues with left arm numbness, question if there might be compression of the esophagus. Will wait until MRI cervical spine complete before referral to GI. Follow up in 4 weeks.

## 2015-07-24 NOTE — Assessment & Plan Note (Signed)
Persistent symptoms > 3years. Suspect cervical radiculopathy. Will set up evaluation with MRI cervical spine. Also discussed neurology evaluation. Follow up after imaging.

## 2015-07-24 NOTE — Progress Notes (Signed)
Subjective:    Patient ID: Gary Pace, male    DOB: March 07, 1975, 40 y.o.   MRN: [id number]  HPI  40YO male presents for physical exam.  Generally, feeling well.  Left arm numbness - ongoing for over 3 years. Intermittent numbness in entire left arm with certain movements. No previous injury. History of carpal tunnel repair on left hand after EMG testing showed this prior to 2014. However, this numbness is more extensive up to shoulder. No weakness noted. Symptoms improve typically with change in position.  Dysphagia - also has occasional dysphagia in lower throat/upper esophagus, when eating foods such as meats. Only occurs on occasion. Resolves with drinking water.  Wt Readings from Last 3 Encounters:  07/24/15 222 lb 6.4 oz (100.88 kg)  06/30/15 227 lb 8 oz (103.193 kg)  10/27/14 218 lb 2 oz (98.941 kg)   BP Readings from Last 3 Encounters:  07/24/15 98/68  06/30/15 122/78  10/27/14 122/74    Past Medical History  Diagnosis Date  . Enlarged prostate     Followed by Dr. Achilles Dunk   Family History  Problem Relation Age of Onset  . Thyroid disease Brother   . Cancer Paternal Grandfather   . Stroke Paternal Grandfather   . Diabetes Paternal Grandfather   . Cancer Paternal Aunt     breast, brain  . Stroke Paternal Aunt    Past Surgical History  Procedure Laterality Date  . Tonsillectomy and adenoidectomy  2008  . Carpal tunnel release Right     [facility]   Social History   Social History  . Marital Status: Single    Spouse Name: N/A  . Number of Children: N/A  . Years of Education: N/A   Social History Main Topics  . Smoking status: Never Smoker   . Smokeless tobacco: Never Used  . Alcohol Use: No  . Drug Use: No  . Sexual Activity: Not Asked   Other Topics Concern  . None   Social History Narrative   Lives in [place]. 2 dogs.      Work - [employer], Tour manager      Diet - regular      Exercise - none    Review of Systems  Constitutional: Negative for  fever, chills, activity change, appetite change, fatigue and unexpected weight change.  HENT: Positive for trouble swallowing.   Eyes: Negative for visual disturbance.  Respiratory: Negative for cough and shortness of breath.   Cardiovascular: Negative for chest pain, palpitations and leg swelling.  Gastrointestinal: Negative for nausea, vomiting, abdominal pain, diarrhea, constipation and abdominal distention.  Genitourinary: Negative for dysuria, urgency and difficulty urinating.  Musculoskeletal: Negative for arthralgias and gait problem.  Skin: Negative for color change and rash.  Neurological: Positive for numbness. Negative for dizziness, seizures, syncope, facial asymmetry, speech difficulty, weakness, light-headedness and headaches.  Hematological: Negative for adenopathy.  Psychiatric/Behavioral: Negative for suicidal ideas, sleep disturbance and dysphoric mood. The patient is not nervous/anxious.        Objective:    BP 98/68 mmHg  Pulse 52  Ht  (1.676 m)  Wt 222 lb 6.4 oz (100.88 kg)  BMI 35.91 kg/m2  SpO2 96% Physical Exam  Constitutional: He is oriented to person, place, and time. He appears well-developed and well-nourished. No distress.  HENT:  Head: Normocephalic and atraumatic.  Right Ear: External ear normal.  Left Ear: External ear normal.  Nose: Nose normal.  Mouth/Throat: Oropharynx is clear and moist. No oropharyngeal exudate.  Eyes: Conjunctivae  and EOM are normal. Pupils are equal, round, and reactive to light. Right eye exhibits no discharge. Left eye exhibits no discharge. No scleral icterus.  Neck: Normal range of motion. Neck supple. No tracheal deviation present. No thyromegaly present.  Cardiovascular: Normal rate, regular rhythm and normal heart sounds.  Exam reveals no gallop and no friction rub.   No murmur heard. Pulmonary/Chest: Effort normal and breath sounds normal. No respiratory distress. He has no wheezes. He has no rales. He exhibits no  tenderness.  Abdominal: Soft. Bowel sounds are normal. He exhibits no distension and no mass. There is no tenderness. There is no rebound and no guarding.  Musculoskeletal: Normal range of motion. He exhibits no edema.  Lymphadenopathy:    He has no cervical adenopathy.  Neurological: He is alert and oriented to person, place, and time. No cranial nerve deficit. Coordination normal.  Skin: Skin is warm and dry. No rash noted. He is not diaphoretic. No erythema. No pallor.  Psychiatric: He has a normal mood and affect. His behavior is normal. Judgment and thought content normal.          Assessment & Plan:   Problem List Items Addressed This Visit      Unprioritized   Arm numbness    Persistent symptoms > 3years. Suspect cervical radiculopathy. Will set up evaluation with MRI cervical spine. Also discussed neurology evaluation. Follow up after imaging.      Relevant Orders   MR Cervical Spine Wo Contrast   Candidiasis of skin    Discussed preventative measures and will also start topical Nystatin.      Relevant Medications   nystatin (NYSTATIN) powder   Dysphagia    Dysphagia intermittently with dry, solid foods. Discussed GI evaluation, however given recent issues with left arm numbness, question if there might be compression of the esophagus. Will wait until MRI cervical spine complete before referral to GI. Follow up in 4 weeks.      Routine general medical examination at a health care facility - Primary    General medical exam normal today. Labs as ordered. Encouraged healthy diet and exercise.      Relevant Orders   CBC with Differential/Platelet   Comprehensive metabolic panel   Lipid panel   Microalbumin / creatinine urine ratio   PSA, total and free       Return in about 4 weeks (around 08/21/2015) for Recheck.  Ronna PolioJennifer Lauris Keepers, MD Internal Medicine [facility]

## 2015-07-24 NOTE — Assessment & Plan Note (Signed)
Discussed preventative measures and will also start topical Nystatin.

## 2015-07-24 NOTE — Assessment & Plan Note (Signed)
General medical exam normal today. Labs as ordered. Encouraged healthy diet and exercise.

## 2015-07-25 LAB — CBC WITH DIFFERENTIAL/PLATELET
Basophils Absolute: 0 10*3/uL (ref 0.0–0.1)
Basophils Relative: 0.4 % (ref 0.0–3.0)
Eosinophils Absolute: 0.4 10*3/uL (ref 0.0–0.7)
Eosinophils Relative: 4.7 % (ref 0.0–5.0)
HCT: 43 % (ref 39.0–52.0)
Hemoglobin: 14.4 g/dL (ref 13.0–17.0)
Lymphocytes Relative: 35.6 % (ref 12.0–46.0)
Lymphs Abs: 3.2 10*3/uL (ref 0.7–4.0)
MCHC: 33.5 g/dL (ref 30.0–36.0)
MCV: 84.3 fl (ref 78.0–100.0)
Monocytes Absolute: 0.7 10*3/uL (ref 0.1–1.0)
Monocytes Relative: 7.7 % (ref 3.0–12.0)
Neutro Abs: 4.6 10*3/uL (ref 1.4–7.7)
Neutrophils Relative %: 51.6 % (ref 43.0–77.0)
Platelets: 278 10*3/uL (ref 150.0–400.0)
RBC: 5.1 Mil/uL (ref 4.22–5.81)
RDW: 13.2 % (ref 11.5–15.5)
WBC: 9 10*3/uL (ref 4.0–10.5)

## 2015-07-25 LAB — LIPID PANEL
Cholesterol: 169 mg/dL (ref 0–200)
HDL: 39.7 mg/dL (ref >39.00)
LDL Cholesterol: 111 mg/dL — ABNORMAL HIGH (ref 0–99)
NonHDL: 129.73
Total CHOL/HDL Ratio: 4
Triglycerides: 96 mg/dL (ref 0.0–149.0)
VLDL: 19.2 mg/dL (ref 0.0–40.0)

## 2015-07-25 LAB — COMPREHENSIVE METABOLIC PANEL
ALT: 23 U/L (ref 0–53)
AST: 17 U/L (ref 0–37)
Albumin: 4.3 g/dL (ref 3.5–5.2)
Alkaline Phosphatase: 41 U/L (ref 39–117)
BUN: 14 mg/dL (ref 6–23)
CO2: 32 mEq/L (ref 19–32)
Calcium: 9.4 mg/dL (ref 8.4–10.5)
Chloride: 105 mEq/L (ref 96–112)
Creatinine, Ser: 1 mg/dL (ref 0.40–1.50)
GFR: 106.27 mL/min (ref >60.00)
Glucose, Bld: 84 mg/dL (ref 70–99)
Potassium: 4.1 mEq/L (ref 3.5–5.1)
Sodium: 141 mEq/L (ref 135–145)
Total Bilirubin: 0.5 mg/dL (ref 0.2–1.2)
Total Protein: 6.9 g/dL (ref 6.0–8.3)

## 2015-07-25 LAB — PSA, TOTAL AND FREE
PSA, Free Pct: 44 % (ref >25)
PSA, Free: 0.11 ng/mL
PSA: 0.25 ng/mL (ref <=4.00)

## 2015-07-25 LAB — MICROALBUMIN / CREATININE URINE RATIO
Creatinine,U: 351.2 mg/dL
Microalb Creat Ratio: 0.2 mg/g (ref 0.0–30.0)
Microalb, Ur: 0.8 mg/dL (ref 0.0–1.9)

## 2015-07-27 ENCOUNTER — Ambulatory Visit
Admission: RE | Admit: 2015-07-27 | Discharge: 2015-07-27 | Disposition: A | Discharge status: Home / Self Care | Payer: 59 | Source: Ambulatory Visit | Attending: Internal Medicine | Admitting: Internal Medicine

## 2015-07-27 ENCOUNTER — Ambulatory Visit: Payer: 59

## 2015-07-27 DIAGNOSIS — R200 Anesthesia of skin: Secondary | ICD-10-CM

## 2015-07-27 DIAGNOSIS — R208 Other disturbances of skin sensation: Secondary | ICD-10-CM | POA: Diagnosis not present

## 2015-07-28 ENCOUNTER — Ambulatory Visit: Payer: 59

## 2015-09-11 ENCOUNTER — Ambulatory Visit (INDEPENDENT_AMBULATORY_CARE_PROVIDER_SITE_OTHER): Payer: 59 | Admitting: Internal Medicine

## 2015-09-11 ENCOUNTER — Encounter: Payer: Self-pay | Admitting: Internal Medicine

## 2015-09-11 VITALS — BP 114/60 | HR 47 | Wt 221.4 lb

## 2015-09-11 DIAGNOSIS — R200 Anesthesia of skin: Secondary | ICD-10-CM

## 2015-09-11 DIAGNOSIS — R208 Other disturbances of skin sensation: Secondary | ICD-10-CM | POA: Diagnosis not present

## 2015-09-11 DIAGNOSIS — Z23 Encounter for immunization: Secondary | ICD-10-CM | POA: Diagnosis not present

## 2015-09-11 NOTE — Assessment & Plan Note (Signed)
Left arm/hand numbness now appears most c/w carpal tunnel. Will set up neurology evaluation for EMG testing. Encouraged use of wrist brace. Follow up after EMG testing.

## 2015-09-11 NOTE — Progress Notes (Signed)
Subjective:    Patient ID: Gary Pace, male    DOB: 12-24-1975, 40 y.o.   MRN: [id number]  HPI  40YO male presents for follow up.  Last seen in June 2017 for arm numbness and dysphagia. MRI cervical spine was ordered. MRI cervical spine was normal.  Continues to have left arm and hand numbness. Remembers he was told had carpal tunnel in the past.  No recurrent issues with dysphagia. No NV.   Wt Readings from Last 3 Encounters:  09/11/15 221 lb 6.4 oz (100.4 kg)  07/24/15 222 lb 6.4 oz (100.9 kg)  06/30/15 227 lb 8 oz (103.2 kg)   BP Readings from Last 3 Encounters:  09/11/15 114/60  07/24/15 98/68  06/30/15 122/78    Past Medical History:  Diagnosis Date  . Enlarged prostate    Followed by Dr. Achilles Dunk   Family History  Problem Relation Age of Onset  . Thyroid disease Brother   . Cancer Paternal Grandfather   . Stroke Paternal Grandfather   . Diabetes Paternal Grandfather   . Cancer Paternal Aunt     breast, brain  . Stroke Paternal Aunt    Past Surgical History:  Procedure Laterality Date  . CARPAL TUNNEL RELEASE Right    [facility]  . TONSILLECTOMY AND ADENOIDECTOMY  2008   Social History   Social History  . Marital status: Single    Spouse name: N/A  . Number of children: N/A  . Years of education: N/A   Social History Main Topics  . Smoking status: Never Smoker  . Smokeless tobacco: Never Used  . Alcohol use No  . Drug use: No  . Sexual activity: Not Asked   Other Topics Concern  . None   Social History Narrative   Lives in [place]. 2 dogs.      Work - [employer], Tour manager      Diet - regular      Exercise - none    Review of Systems  Constitutional: Negative for activity change, appetite change, chills, fatigue, fever and unexpected weight change.  Eyes: Negative for visual disturbance.  Respiratory: Negative for cough and shortness of breath.   Cardiovascular: Negative for chest pain, palpitations and leg swelling.  Gastrointestinal:  Negative for abdominal distention and abdominal pain.  Genitourinary: Negative for difficulty urinating, dysuria and urgency.  Musculoskeletal: Negative for arthralgias and gait problem.  Skin: Negative for color change and rash.  Neurological: Positive for numbness. Negative for weakness.  Hematological: Negative for adenopathy.  Psychiatric/Behavioral: Negative for dysphoric mood and sleep disturbance. The patient is not nervous/anxious.        Objective:    BP 114/60 (BP Location: Left Arm, Patient Position: Sitting, Cuff Size: Large)   Pulse (!) 47   Wt 221 lb 6.4 oz (100.4 kg)   SpO2 95%   BMI 35.73 kg/m  Physical Exam  Constitutional: He is oriented to person, place, and time. He appears well-developed and well-nourished. No distress.  HENT:  Head: Normocephalic and atraumatic.  Right Ear: External ear normal.  Left Ear: External ear normal.  Nose: Nose normal.  Mouth/Throat: Oropharynx is clear and moist. No oropharyngeal exudate.  Eyes: Conjunctivae and EOM are normal. Pupils are equal, round, and reactive to light. Right eye exhibits no discharge. Left eye exhibits no discharge. No scleral icterus.  Neck: Normal range of motion. Neck supple. No tracheal deviation present. No thyromegaly present.  Cardiovascular: Normal rate, regular rhythm and normal heart sounds.  Exam reveals no  gallop and no friction rub.   No murmur heard. Pulmonary/Chest: Effort normal and breath sounds normal. No accessory muscle usage. No tachypnea. No respiratory distress. He has no decreased breath sounds. He has no wheezes. He has no rhonchi. He has no rales. He exhibits no tenderness.  Musculoskeletal: Normal range of motion. He exhibits no edema.  Lymphadenopathy:    He has no cervical adenopathy.  Neurological: He is alert and oriented to person, place, and time. He displays no atrophy and no tremor. A sensory deficit (intermittent numbness left foreamr reported, however sensation to light  touch intact) is present. No cranial nerve deficit. He exhibits normal muscle tone. Coordination normal.  Skin: Skin is warm and dry. No rash noted. He is not diaphoretic. No erythema. No pallor.  Psychiatric: He has a normal mood and affect. His behavior is normal. Judgment and thought content normal.          Assessment & Plan:   Problem List Items Addressed This Visit      Unprioritized   Arm numbness - Primary    Left arm/hand numbness now appears most c/w carpal tunnel. Will set up neurology evaluation for EMG testing. Encouraged use of wrist brace. Follow up after EMG testing.      Relevant Orders   Ambulatory referral to Neurology    Other Visit Diagnoses   None.      Return in about 3 months (around 12/12/2015) for New Patient.  Ronna Polio, MD Internal Medicine [facility]

## 2015-09-11 NOTE — Patient Instructions (Signed)
We will set up referral to neurology.  Follow up with Dr. Adriana Simas

## 2015-09-11 NOTE — Progress Notes (Signed)
Pre visit review using our clinic review tool, if applicable. No additional management support is needed unless otherwise documented below in the visit note. 

## 2015-11-06 ENCOUNTER — Other Ambulatory Visit: Payer: Self-pay | Admitting: Neurology

## 2015-11-06 DIAGNOSIS — R29898 Other symptoms and signs involving the musculoskeletal system: Secondary | ICD-10-CM

## 2015-11-06 DIAGNOSIS — R202 Paresthesia of skin: Secondary | ICD-10-CM

## 2015-11-06 DIAGNOSIS — R200 Anesthesia of skin: Secondary | ICD-10-CM

## 2015-11-16 ENCOUNTER — Ambulatory Visit: Payer: 59

## 2015-11-17 ENCOUNTER — Ambulatory Visit
Admission: RE | Admit: 2015-11-17 | Discharge: 2015-11-17 | Disposition: A | Discharge status: Home / Self Care | Payer: 59 | Source: Ambulatory Visit | Attending: Neurology | Admitting: Neurology

## 2015-11-17 DIAGNOSIS — R200 Anesthesia of skin: Secondary | ICD-10-CM | POA: Diagnosis present

## 2015-11-17 DIAGNOSIS — R202 Paresthesia of skin: Secondary | ICD-10-CM | POA: Diagnosis present

## 2015-11-17 DIAGNOSIS — R29898 Other symptoms and signs involving the musculoskeletal system: Secondary | ICD-10-CM | POA: Insufficient documentation

## 2015-11-17 DIAGNOSIS — J320 Chronic maxillary sinusitis: Secondary | ICD-10-CM | POA: Diagnosis not present

## 2015-11-17 DIAGNOSIS — R42 Dizziness and giddiness: Secondary | ICD-10-CM | POA: Insufficient documentation

## 2015-11-17 MED ORDER — GADOBENATE DIMEGLUMINE 529 MG/ML IV SOLN
20.0000 mL | Freq: Once | INTRAVENOUS | Status: AC | PRN
  Administered 2015-11-17: 19 mL via INTRAVENOUS

## 2015-12-11 ENCOUNTER — Ambulatory Visit: Payer: 59 | Admitting: Family Medicine

## 2016-09-06 DIAGNOSIS — S59902A Unspecified injury of left elbow, initial encounter: Secondary | ICD-10-CM | POA: Diagnosis not present

## 2016-09-06 DIAGNOSIS — M7712 Lateral epicondylitis, left elbow: Secondary | ICD-10-CM | POA: Diagnosis not present

## 2016-09-09 ENCOUNTER — Encounter: Payer: Self-pay | Admitting: Family Medicine

## 2016-09-09 ENCOUNTER — Ambulatory Visit (INDEPENDENT_AMBULATORY_CARE_PROVIDER_SITE_OTHER): Payer: BLUE CROSS/BLUE SHIELD | Admitting: Family Medicine

## 2016-09-09 DIAGNOSIS — J019 Acute sinusitis, unspecified: Secondary | ICD-10-CM | POA: Diagnosis not present

## 2016-09-09 MED ORDER — AMOXICILLIN-POT CLAVULANATE 875-125 MG PO TABS: 1.0000 | ORAL_TABLET | Freq: Two times a day (BID) | ORAL | 0 refills | Status: DC

## 2016-09-09 NOTE — Patient Instructions (Signed)

## 2016-09-09 NOTE — Assessment & Plan Note (Signed)
New problem. Treating with Augmentin. 

## 2016-09-09 NOTE — Progress Notes (Signed)
Subjective:  Patient ID: Gary Pace, male    DOB: [birth year]  Age: 41 y.o. MRN: [id number]  CC: Sinus issues  HPI:  41 year old male presents with the above complaint.  Patient has a history of seasonal allergies. He reports a two-week history of sinus pressure, congestion, and sinus drainage. No fever. He also reports postnasal drip. He's been taking DayQuil and NyQuil with no relief. Symptoms are moderate in severity. He reports some productive cough particularly in the morning. No known exacerbating factors. No other complaints or concerns at this time.  Social Hx   Social History   Social History  . Marital status: Single    Spouse name: N/A  . Number of children: N/A  . Years of education: N/A   Social History Main Topics  . Smoking status: Never Smoker  . Smokeless tobacco: Never Used  . Alcohol use No  . Drug use: No  . Sexual activity: Not Asked   Other Topics Concern  . None   Social History Narrative   Lives in [place]. 2 dogs.      Work - [employer], Tour manager      Diet - regular      Exercise - none    Review of Systems  Constitutional: Negative for fever.  HENT: Positive for postnasal drip, sinus pain and sinus pressure.    Objective:  BP 120/62 (BP Location: Left Arm, Patient Position: Sitting, Cuff Size: Large)   Pulse (!) 49   Temp 98.2 F (36.8 C) (Oral)   Resp 12   Wt 228 lb 8 oz (103.6 kg)   SpO2 97%   BMI 36.88 kg/m   BP/Weight 09/09/2016 09/11/2015 07/24/2015  Systolic BP 120 114 98  Diastolic BP 62 60 68  Wt. (Lbs) 228.5 221.4 222.4  BMI 36.88 35.73 35.91   Physical Exam  Constitutional: He is oriented to person, place, and time. He appears well-developed. No distress.  HENT:  Head: Normocephalic and atraumatic.  Cobblestoning noted. Oropharynx clear.   Neck: Neck supple.  Cardiovascular: Normal rate and regular rhythm.   Pulmonary/Chest: Effort normal and breath sounds normal. He has no wheezes. He has no rales.  Lymphadenopathy:   He has no cervical adenopathy.  Neurological: He is alert and oriented to person, place, and time.  Psychiatric: He has a normal mood and affect.  Vitals reviewed.   Lab Results  Component Value Date   WBC 9.0 07/24/2015   HGB 14.4 07/24/2015   HCT 43.0 07/24/2015   PLT 278.0 07/24/2015   GLUCOSE 84 07/24/2015   CHOL 169 07/24/2015   TRIG 96.0 07/24/2015   HDL 39.70 07/24/2015   LDLCALC 111 (H) 07/24/2015   ALT 23 07/24/2015   AST 17 07/24/2015   NA 141 07/24/2015   K 4.1 07/24/2015   CL 105 07/24/2015   CREATININE 1.00 07/24/2015   BUN 14 07/24/2015   CO2 32 07/24/2015   TSH 3.01 05/25/2012   PSA 0.25 07/24/2015   MICROALBUR 0.8 07/24/2015    Assessment & Plan:   Problem List Items Addressed This Visit      Respiratory   Acute sinusitis    New problem. Treating with Augmentin.      Relevant Medications   amoxicillin-clavulanate (AUGMENTIN) 875-125 MG tablet      Meds ordered this encounter  Medications  . amoxicillin-clavulanate (AUGMENTIN) 875-125 MG tablet    Sig: Take 1 tablet by mouth 2 (two) times daily.    Dispense:  20 tablet  Refill:  0     Follow-up: PRN  Everlene OtherJayce Fermina Mishkin DO [facility] [place]

## 2016-10-12 IMAGING — MR MR CERVICAL SPINE W/O CM
5 series · 33 of 48 positions shown · non-contrast
Comparison: None.

CLINICAL DATA: 40-year-old male with chronic but progressive left
arm numbness extending to the fingers. Initial encounter.

EXAM:
MRI CERVICAL SPINE WITHOUT CONTRAST
TECHNIQUE: Multiplanar, multisequence MR imaging of the cervical spine was
performed. No intravenous contrast was administered.

[Series 2: T2 · sagittal · 3.0mm · 0.56mm/px · 6 of 15 slices shown (1 of 2)]
[im 1/15]
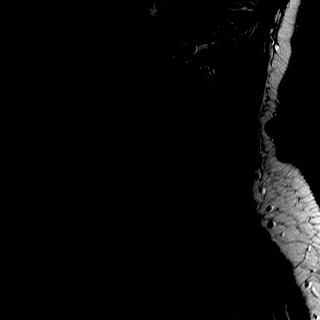
[im 3/15]
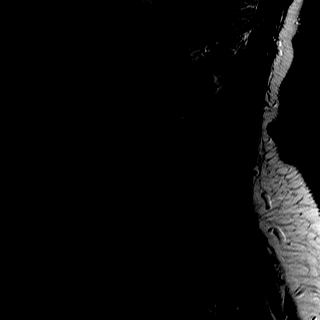
[im 6/15]
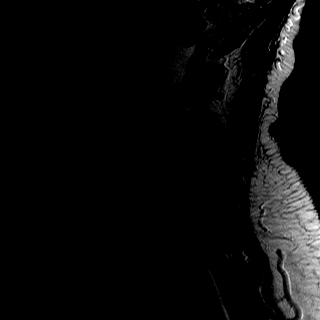
[im 9/15]
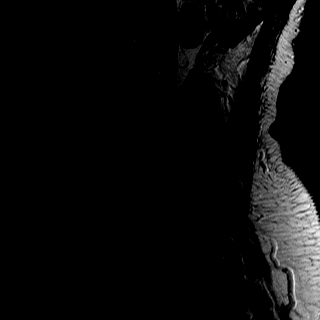
[im 12/15]
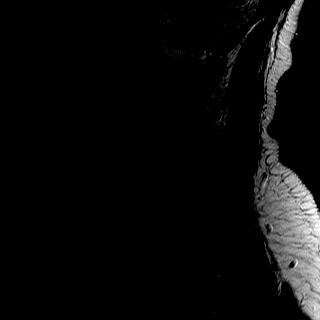
[im 15/15]
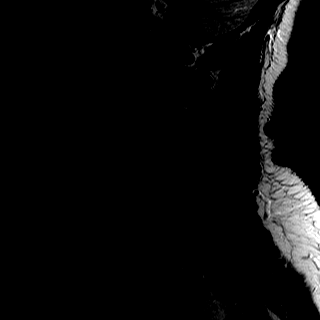

[Series 3: T1 · sagittal · 3.0mm · 0.70mm/px · 7 of 15 slices shown]
[im 1/15]
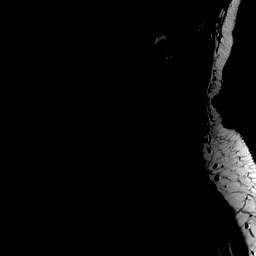
[im 3/15]
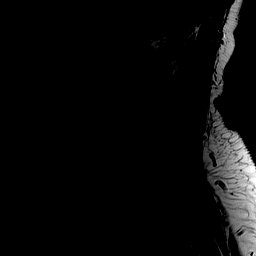
[im 5/15]
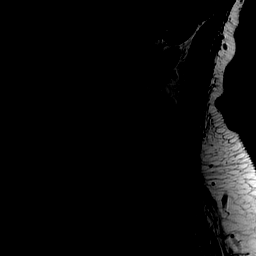
[im 8/15]
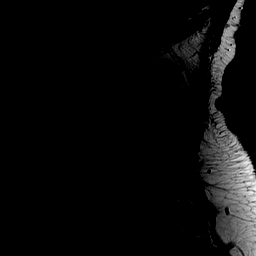
[im 10/15]
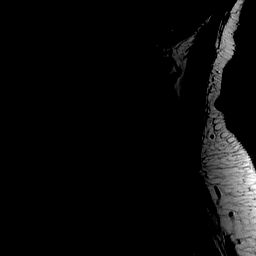
[im 12/15]
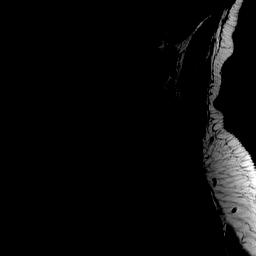
[im 15/15]
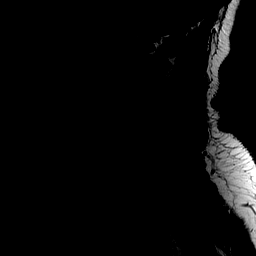

[Series 4: STIR · sagittal · 3.0mm · 0.35mm/px · 7 of 15 slices shown]
[im 1/15]
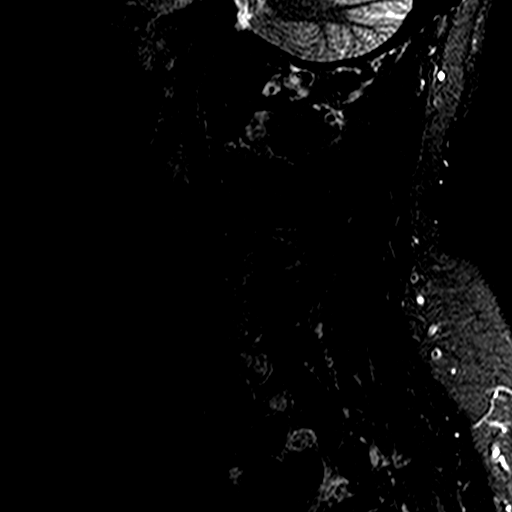
[im 3/15]
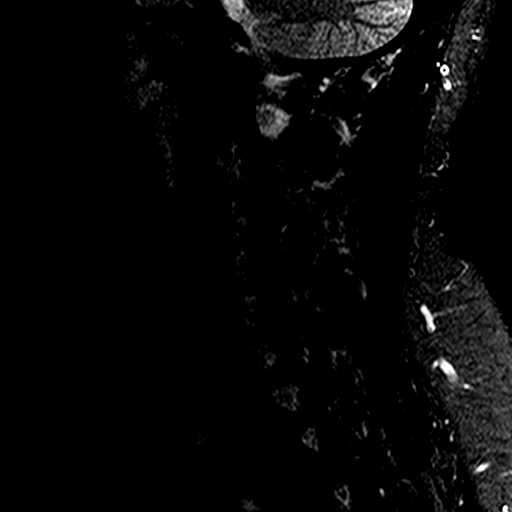
[im 5/15]
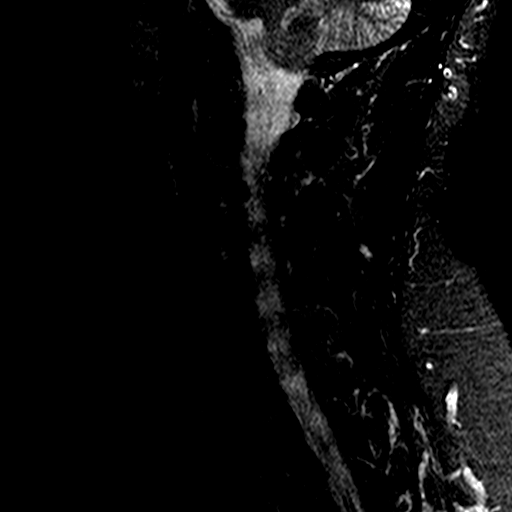
[im 8/15]
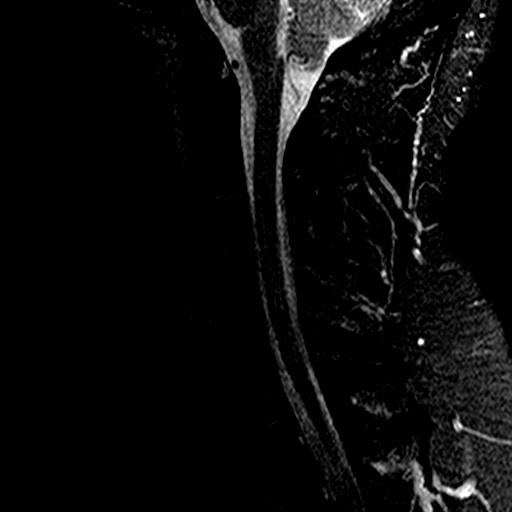
[im 10/15]
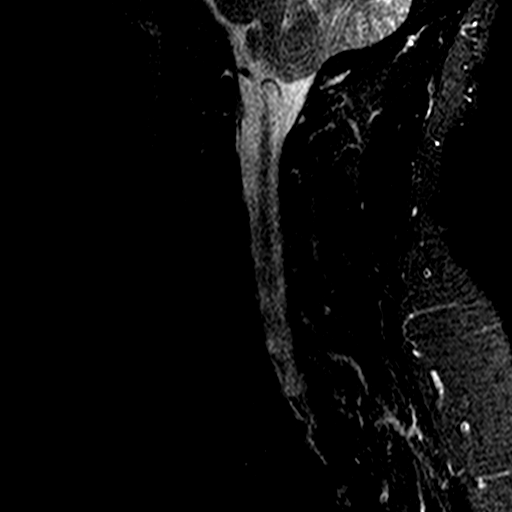
[im 12/15]
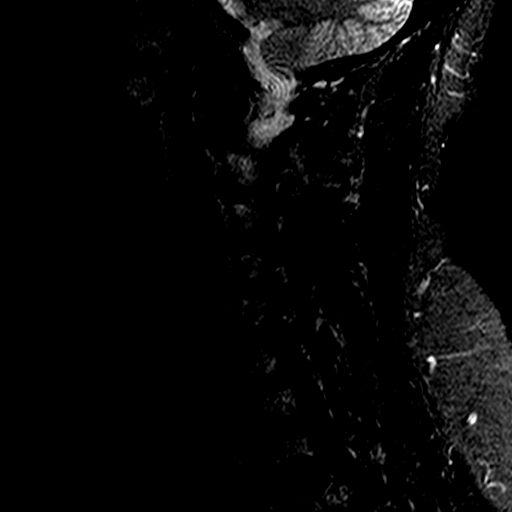
[im 15/15]
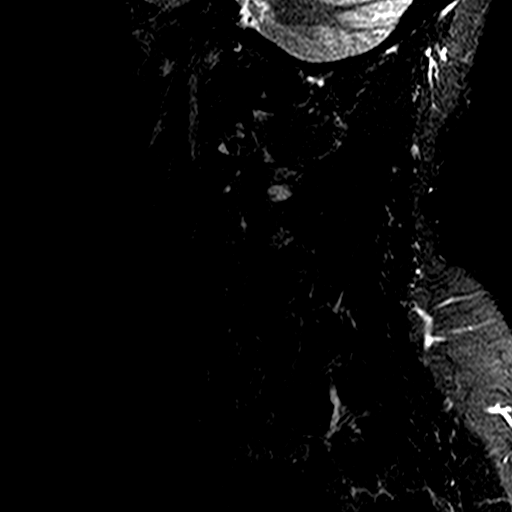

[Series 5: T2 · axial · 3.0mm · 0.70mm/px · z∈[-38,+70]mm · 8 of 29 slices shown (2 of 2)]
[im 1/29]
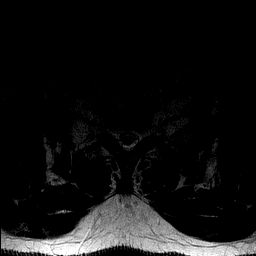
[im 5/29]
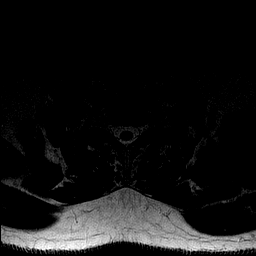
[im 9/29]
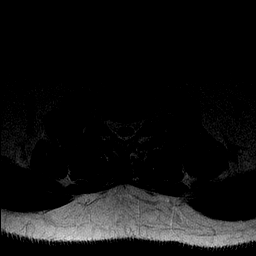
[im 13/29]
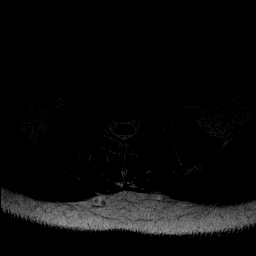
[im 16/29]
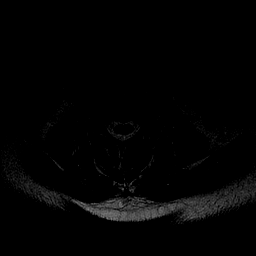
[im 20/29]
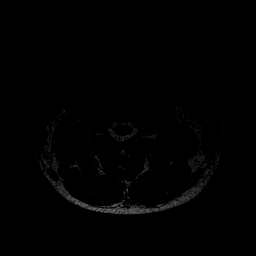
[im 24/29]
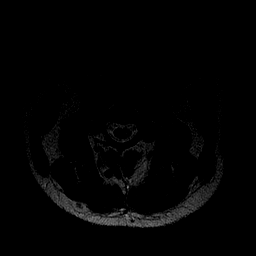
[im 29/29]
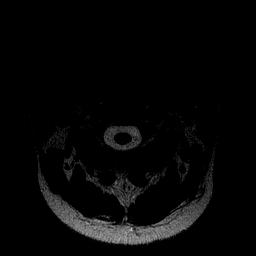

[Series 6: mpgr ax · axial · 3.0mm · 0.35mm/px · z∈[-38,+20]mm · 5 of 29 slices shown]
[im 1/29]
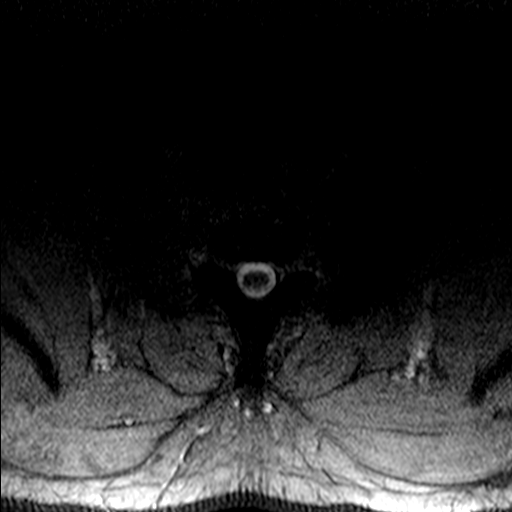
[im 5/29]
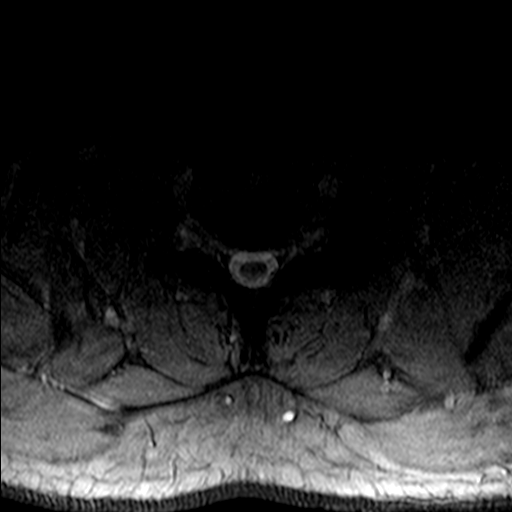
[im 9/29]
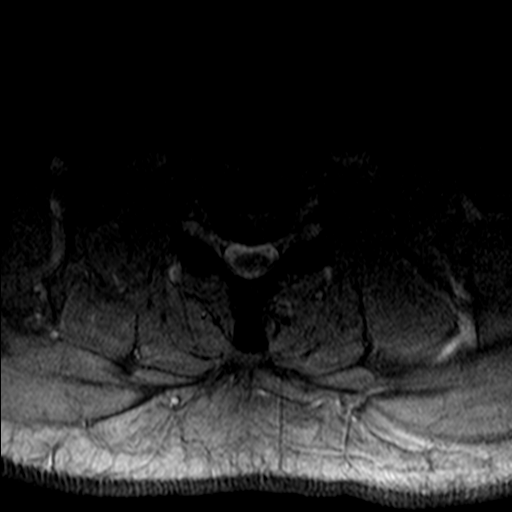
[im 13/29]
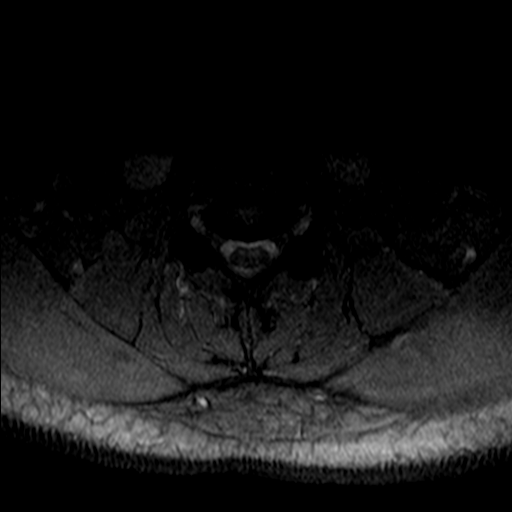
[im 16/29]
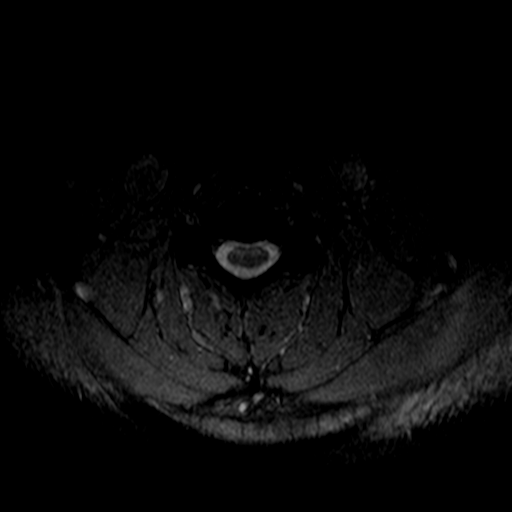

[33 of 48 positions shown; findings below may reference images not displayed]

FINDINGS: Alignment: Mild straightening of lordosis.

Vertebrae: Bone marrow signal is within normal limits. No marrow
edema or evidence of acute osseous abnormality.

Cord: Spinal cord signal is within normal limits at all visualized
levels. Cervicomedullary junction is within normal limits.

Posterior Fossa, vertebral arteries, paraspinal tissues: Negative;
the arterial flow voids in the neck are obscured.

Disc levels:

C2-C3: Mild left facet hypertrophy. Borderline to mild left C3
foraminal stenosis.

C3-C4:  Negative.

C4-C5:  Negative.

C5-C6: Mild left facet hypertrophy. No stenosis and otherwise
negative.

C6-C7:  Negative.

C7-T1:  Negative.

No visualized upper thoracic spinal or left foraminal stenosis.
There does appear to be mild upper thoracic right facet hypertrophy.
IMPRESSION: Essentially normal for age MRI appearance of the cervical spine with
mild degenerative changes, and no spinal stenosis or convincing
neural impingement.

## 2016-11-01 ENCOUNTER — Encounter: Payer: Self-pay | Admitting: Family Medicine

## 2016-11-01 ENCOUNTER — Ambulatory Visit (INDEPENDENT_AMBULATORY_CARE_PROVIDER_SITE_OTHER): Payer: BLUE CROSS/BLUE SHIELD | Admitting: Family Medicine

## 2016-11-01 DIAGNOSIS — L0291 Cutaneous abscess, unspecified: Secondary | ICD-10-CM

## 2016-11-01 MED ORDER — DOXYCYCLINE HYCLATE 100 MG PO TABS: 100.0000 mg | ORAL_TABLET | Freq: Two times a day (BID) | ORAL | 0 refills | Status: DC

## 2016-11-01 NOTE — Patient Instructions (Signed)
Call with concerns.  Take it with food.  Take care  Dr. Adriana Simas    Skin Abscess A skin abscess is an infected area on or under your skin that contains a collection of pus and other material. An abscess may also be called a furuncle, carbuncle, or boil. An abscess can occur in or on almost any part of your body. Some abscesses break open (rupture) on their own. Most continue to get worse unless they are treated. The infection can spread deeper into the body and eventually into your blood, which can make you feel ill. Treatment usually involves draining the abscess. What are the causes? An abscess occurs when germs, often bacteria, pass through your skin and cause an infection. This may be caused by:  A scrape or cut on your skin.  A puncture wound through your skin, including a needle injection.  Blocked oil or sweat glands.  Blocked and infected hair follicles.  A cyst that forms beneath your skin (sebaceous cyst) and becomes infected.  What increases the risk? This condition is more likely to develop in people who:  Have a weak body defense system (immune system).  Have diabetes.  Have dry and irritated skin.  Get frequent injections or use illegal IV drugs.  Have a foreign body in a wound, such as a splinter.  Have problems with their lymph system or veins.  What are the signs or symptoms? An abscess may start as a painful, firm bump under the skin. Over time, the abscess may get larger or become softer. Pus may appear at the top of the abscess, causing pressure and pain. It may eventually break through the skin and drain. Other symptoms include:  Redness.  Warmth.  Swelling.  Tenderness.  A sore on the skin.  How is this diagnosed? This condition is diagnosed based on your medical history and a physical exam. A sample of pus may be taken from the abscess to find out what is causing the infection and what antibiotics can be used to treat it. You also may  have:  Blood tests to look for signs of infection or spread of an infection to your blood.  Imaging studies such as ultrasound, CT scan, or MRI if the abscess is deep.  How is this treated? Small abscesses that drain on their own may not need treatment. Treatment for an abscess that does not rupture on its own may include:  Warm compresses applied to the area several times per day.  Incision and drainage. Your health care provider will make an incision to open the abscess and will remove pus and any foreign body or dead tissue. The incision area may be packed with gauze to keep it open for a few days while it heals.  Antibiotic medicines to treat infection. For a severe abscess, you may first get antibiotics through an IV and then change to oral antibiotics.  Follow these instructions at home: Abscess Care  If you have an abscess that has not drained, place a warm, clean, wet washcloth over the abscess several times a day. Do this as told by your health care provider.  Follow instructions from your health care provider about how to take care of your abscess. Make sure you: ? Cover the abscess with a bandage (dressing). ? Change your dressing or gauze as told by your health care provider. ? Wash your hands with soap and water before you change the dressing or gauze. If soap and water are not available, use  hand sanitizer.  Check your abscess every day for signs of a worsening infection. Check for: ? More redness, swelling, or pain. ? More fluid or blood. ? Warmth. ? More pus or a bad smell. Medicines  Take over-the-counter and prescription medicines only as told by your health care provider.  If you were prescribed an antibiotic medicine, take it as told by your health care provider. Do not stop taking the antibiotic even if you start to feel better. General instructions  To avoid spreading the infection: ? Do not share personal care items, towels, or hot tubs with  others. ? Avoid making skin contact with other people.  Keep all follow-up visits as told by your health care provider. This is important. Contact a health care provider if:  You have more redness, swelling, or pain around your abscess.  You have more fluid or blood coming from your abscess.  Your abscess feels warm to the touch.  You have more pus or a bad smell coming from your abscess.  You have a fever.  You have muscle aches.  You have chills or a general ill feeling. Get help right away if:  You have severe pain.  You see red streaks on your skin spreading away from the abscess. This information is not intended to replace advice given to you by your health care provider. Make sure you discuss any questions you have with your health care provider. Document Released: 11/14/2004 Document Revised: 10/01/2015 Document Reviewed: 12/14/2014 Elsevier Interactive Patient Education  [employer].

## 2016-11-02 DIAGNOSIS — L0291 Cutaneous abscess, unspecified: Secondary | ICD-10-CM | POA: Insufficient documentation

## 2016-11-02 NOTE — Progress Notes (Signed)
   Subjective:  Patient ID: Gary Pace, male    DOB: Jun 23, 1975  Age: 41 y.o. MRN: [id number]  CC: Lump/bump under chin  HPI:  41 year old male presents with the above complaint.  Patient has had a lump/bump under his chin for the past 2 days.  No fever, chills. Slightly tender to palpation. Mild in severity. No redness. Likely incited by shaving. No medications/interventions tried. No known exacerbating factors. No other associated symptoms. No other complaints at this time.  Social Hx   Social History   Social History  . Marital status: Single    Spouse name: N/A  . Number of children: N/A  . Years of education: N/A   Social History Main Topics  . Smoking status: Never Smoker  . Smokeless tobacco: Never Used  . Alcohol use No  . Drug use: No  . Sexual activity: Not Asked   Other Topics Concern  . None   Social History Narrative   Lives in [place]. 2 dogs.      Work - [employer], Tour manager      Diet - regular      Exercise - none    Review of Systems  Constitutional: Negative.   Skin:       Lump/bump under chin. Tender.   Objective:  BP 110/80 (BP Location: Left Arm, Patient Position: Sitting, Cuff Size: Large)   Pulse (!) 49   Temp 98.2 F (36.8 C) (Oral)   Resp 12   Wt 227 lb (103 kg)   SpO2 95%   BMI 36.64 kg/m   BP/Weight 11/01/2016 09/09/2016 09/11/2015  Systolic BP 110 120 114  Diastolic BP 80 62 60  Wt. (Lbs) 227 228.5 221.4  BMI 36.64 36.88 35.73    Physical Exam  Constitutional: He is oriented to person, place, and time. He appears well-developed. No distress.  Cardiovascular: Normal rate and regular rhythm.   No murmur heard. Pulmonary/Chest: Effort normal. He has no wheezes. He has no rales.  Neurological: He is alert and oriented to person, place, and time.  Skin:  ~1.5 cm area of induration noted (midline below the chin). No redness. No fluctuance.  Psychiatric: He has a normal mood and affect.  Vitals reviewed.   Lab Results  Component  Value Date   WBC 9.0 07/24/2015   HGB 14.4 07/24/2015   HCT 43.0 07/24/2015   PLT 278.0 07/24/2015   GLUCOSE 84 07/24/2015   CHOL 169 07/24/2015   TRIG 96.0 07/24/2015   HDL 39.70 07/24/2015   LDLCALC 111 (H) 07/24/2015   ALT 23 07/24/2015   AST 17 07/24/2015   NA 141 07/24/2015   K 4.1 07/24/2015   CL 105 07/24/2015   CREATININE 1.00 07/24/2015   BUN 14 07/24/2015   CO2 32 07/24/2015   TSH 3.01 05/25/2012   PSA 0.25 07/24/2015   MICROALBUR 0.8 07/24/2015    Assessment & Plan:   Problem List Items Addressed This Visit    Abscess    New problem. Developing abscess.  Treating with Doxycycline. No fluctuance to warrant I & D.         Meds ordered this encounter  Medications  . doxycycline (VIBRA-TABS) 100 MG tablet    Sig: Take 1 tablet (100 mg total) by mouth 2 (two) times daily.    Dispense:  14 tablet    Refill:  0     Follow-up: PRN  Everlene Other DO [facility]

## 2016-11-02 NOTE — Assessment & Plan Note (Signed)
New problem. Developing abscess.  Treating with Doxycycline. No fluctuance to warrant I & D.

## 2016-11-13 ENCOUNTER — Telehealth: Payer: Self-pay | Admitting: Family Medicine

## 2016-11-13 NOTE — Telephone Encounter (Signed)
Patient has completed doxycycline prescribed by Dr.Cook for abscess under chin. Patient denies having any pain, fever, chills, or any acute symptoms. Patient says that the abscess has drained some but it is still hard. I have scheduled him to see you tomorrow morning to determine whether he should be treated with more antibiotics or if he needs I&D.

## 2016-11-13 NOTE — Telephone Encounter (Signed)
Pt called and stated that he finished the antibiotic that Dr. Adriana Simas gave him for the abscess under his chin. Pt states that it has drained but not better. Please advise, thank yo!  Call pt @ [phone]

## 2016-11-14 ENCOUNTER — Ambulatory Visit (INDEPENDENT_AMBULATORY_CARE_PROVIDER_SITE_OTHER): Payer: BLUE CROSS/BLUE SHIELD | Admitting: Family

## 2016-11-14 ENCOUNTER — Encounter: Payer: Self-pay | Admitting: Family

## 2016-11-14 VITALS — BP 120/78 | HR 53 | Temp 98.1°F | Ht 66.0 in | Wt 228.4 lb

## 2016-11-14 DIAGNOSIS — L0291 Cutaneous abscess, unspecified: Secondary | ICD-10-CM | POA: Diagnosis not present

## 2016-11-14 MED ORDER — DOXYCYCLINE HYCLATE 100 MG PO TABS: 100.0000 mg | ORAL_TABLET | Freq: Two times a day (BID) | ORAL | 0 refills | Status: DC

## 2016-11-14 MED ORDER — MUPIROCIN 2 % EX OINT: 1.0000 "application " | TOPICAL_OINTMENT | Freq: Two times a day (BID) | CUTANEOUS | 0 refills | Status: DC

## 2016-11-14 NOTE — Patient Instructions (Signed)
One more week of doxycycline. Also Bactroban ointment. Please use this if you feel an early abscess coming on Let us know if not better.

## 2016-11-14 NOTE — Progress Notes (Signed)
Subjective:    Patient ID: Gary Pace, male    DOB: April 03, 1975, 41 y.o.   MRN: [id number]  CC: Gary Pace is a 41 y.o. male who presents today for an acute visit.    HPI: Abscess under chin, Better and 'shrunk' since being on antibiotics.   No more drainage in a couple of days. No fever, N, v. Otherwise, feels fine.   No h.o mrsa or prior boils.    Patient seen 9/14 for abscess under chin. Started on doxycycline for 7 days. HISTORY:  Past Medical History:  Diagnosis Date  . Enlarged prostate    Followed by Dr. Achilles Dunk   Past Surgical History:  Procedure Laterality Date  . CARPAL TUNNEL RELEASE Right    [facility]  . TONSILLECTOMY AND ADENOIDECTOMY  2008   Family History  Problem Relation Age of Onset  . Thyroid disease Brother   . Cancer Paternal Grandfather   . Stroke Paternal Grandfather   . Diabetes Paternal Grandfather   . Cancer Paternal Aunt        breast, brain  . Stroke Paternal Aunt     Allergies: Patient has no known allergies. Current Outpatient Prescriptions on File Prior to Visit  Medication Sig Dispense Refill  . fluticasone (FLONASE) 50 MCG/ACT nasal spray Place into both nostrils daily as needed for allergies or rhinitis.    Marland Kitchen loratadine (CLARITIN) 10 MG tablet Take 10 mg by mouth daily as needed for allergies.     No current facility-administered medications on file prior to visit.     Social History  Substance Use Topics  . Smoking status: Never Smoker  . Smokeless tobacco: Never Used  . Alcohol use No    Review of Systems  Constitutional: Negative for chills and fever.  Respiratory: Negative for cough.   Cardiovascular: Negative for chest pain and palpitations.  Gastrointestinal: Negative for nausea and vomiting.  Skin: Positive for wound.      Objective:    BP 120/78   Pulse (!) 53   Temp 98.1 F (36.7 C) (Oral)   Ht  (1.676 m)   Wt 228 lb 6.4 oz (103.6 kg)   SpO2 97%   BMI 36.86 kg/m    Physical Exam    Constitutional: He appears well-developed and well-nourished.  Neck:    1cm nodule appreciated as marked on diagram. No tenderness, erythema. Nonfluctuant. No discharge.  Cardiovascular: Regular rhythm and normal heart sounds.   Pulmonary/Chest: Effort normal and breath sounds normal. No respiratory distress. He has no wheezes. He has no rhonchi. He has no rales.  Neurological: He is alert.  Skin: Skin is warm and dry.  Psychiatric: He has a normal mood and affect. His speech is normal and behavior is normal.  Vitals reviewed.      Assessment & Plan:   Problem List Items Addressed This Visit      Other   Abscess - Primary    Patient is afebrile, well-appearing. Abscess has improved in size. Given patient another round of doxycycline and ointment to ensure complete resolution. Return precautions given.      Relevant Medications   doxycycline (VIBRA-TABS) 100 MG tablet   mupirocin ointment (BACTROBAN) 2 %        I am having Gary Pace start on mupirocin ointment. I am also having him maintain his fluticasone, loratadine, and doxycycline.   Meds ordered this encounter  Medications  . doxycycline (VIBRA-TABS) 100 MG tablet    Sig: Take 1  tablet (100 mg total) by mouth 2 (two) times daily.    Dispense:  14 tablet    Refill:  0    Order Specific Question:   Supervising Provider    Answer:   Duncan Dull L [[id number]]  . mupirocin ointment (BACTROBAN) 2 %    Sig: Place 1 application into the nose 2 (two) times daily.    Dispense:  22 g    Refill:  0    Order Specific Question:   Supervising Provider    Answer:   Sherlene Shams [[id number]]    Return precautions given.   Risks, benefits, and alternatives of the medications and treatment plan prescribed today were discussed, and patient expressed understanding.   Education regarding symptom management and diagnosis given to patient on AVS.  Continue to follow with Gary Sams, DO for routine health maintenance.   [name] and I agreed with plan.   Rennie Plowman, FNP

## 2016-11-14 NOTE — Progress Notes (Signed)
Pre visit review using our clinic review tool, if applicable. No additional management support is needed unless otherwise documented below in the visit note. 

## 2016-11-14 NOTE — Assessment & Plan Note (Signed)
Patient is afebrile, well-appearing. Abscess has improved in size. Given patient another round of doxycycline and ointment to ensure complete resolution. Return precautions given.

## 2017-02-06 ENCOUNTER — Encounter: Payer: Self-pay | Admitting: Family Medicine

## 2017-02-06 ENCOUNTER — Ambulatory Visit: Payer: BLUE CROSS/BLUE SHIELD | Admitting: Family Medicine

## 2017-02-06 DIAGNOSIS — N4340 Spermatocele of epididymis, unspecified: Secondary | ICD-10-CM | POA: Diagnosis not present

## 2017-02-06 DIAGNOSIS — R0981 Nasal congestion: Secondary | ICD-10-CM

## 2017-02-06 DIAGNOSIS — R339 Retention of urine, unspecified: Secondary | ICD-10-CM | POA: Diagnosis not present

## 2017-02-06 DIAGNOSIS — N401 Enlarged prostate with lower urinary tract symptoms: Secondary | ICD-10-CM | POA: Diagnosis not present

## 2017-02-06 DIAGNOSIS — N411 Chronic prostatitis: Secondary | ICD-10-CM | POA: Diagnosis not present

## 2017-02-06 MED ORDER — AMOXICILLIN-POT CLAVULANATE 875-125 MG PO TABS: 1.0000 | ORAL_TABLET | Freq: Two times a day (BID) | ORAL | 0 refills | Status: DC

## 2017-02-06 NOTE — Progress Notes (Signed)
Sx started about a few weeks ago.  Frontal pain, some occipital pain.  Fatigued.  Not sleeping as well as baseline.  The room doesn't spin and he isn't lightheaded but he can feel off balance episodically.  No fevers.  No vomiting.  No cough.  No chest sx.  No ST.  No ear pain.  Can still move TM with valsalva at time of OV.  Overall not better, not worse recently.  Discolored nasal discharge.  No known sick contacts.  No wheeze. Had been on flonase steady for the last few weeks, not usually needed when he is feeling well.    Still using flonase in the AM, feels some better after a hot shower.    Meds, vitals, and allergies reviewed.   ROS: Per HPI unless specifically indicated in ROS section   GEN: nad, alert and oriented HEENT: mucous membranes moist, tm w/o erythema, nasal exam w/o erythema, clear discharge noted,  OP with cobblestoning, sinuses not ttp x4 NECK: supple w/o LA CV: rrr.   PULM: ctab, no inc wob

## 2017-02-06 NOTE — Patient Instructions (Signed)
Use nasal saline as much as needed, just not right after flonase.  Increase the flonase to 2 sprays per nostril daily.  If not better in a few days, then start the antibiotics.  Take care.  Glad to see you.

## 2017-02-07 DIAGNOSIS — R0981 Nasal congestion: Secondary | ICD-10-CM | POA: Insufficient documentation

## 2017-02-07 NOTE — Assessment & Plan Note (Signed)
Nontoxic.  Sinuses are nontender.  Okay for outpatient follow-up.  Differential discussed with patient.  Treatment options discussed with patient. Use nasal saline as much as needed, just not right after flonase.  Increase the flonase to 2 sprays per nostril daily.  If not better in a few days, then start the Augmentin.  He agrees.

## 2017-08-19 DIAGNOSIS — L820 Inflamed seborrheic keratosis: Secondary | ICD-10-CM | POA: Diagnosis not present

## 2018-03-18 ENCOUNTER — Telehealth: Payer: Self-pay | Admitting: Family

## 2018-03-18 ENCOUNTER — Other Ambulatory Visit (HOSPITAL_COMMUNITY)
Admission: RE | Admit: 2018-03-18 | Discharge: 2018-03-18 | Disposition: A | Discharge status: Home / Self Care | Payer: BLUE CROSS/BLUE SHIELD | Source: Ambulatory Visit | Attending: Family | Admitting: Family

## 2018-03-18 ENCOUNTER — Ambulatory Visit
Admission: RE | Admit: 2018-03-18 | Discharge: 2018-03-18 | Disposition: A | Discharge status: Home / Self Care | Payer: BLUE CROSS/BLUE SHIELD | Source: Ambulatory Visit | Attending: Family | Admitting: Family

## 2018-03-18 ENCOUNTER — Ambulatory Visit: Payer: BLUE CROSS/BLUE SHIELD | Admitting: Family

## 2018-03-18 ENCOUNTER — Encounter: Payer: Self-pay | Admitting: Family

## 2018-03-18 VITALS — BP 140/84 | HR 56 | Temp 97.9°F | Resp 18 | Ht 68.0 in | Wt 238.1 lb

## 2018-03-18 DIAGNOSIS — R030 Elevated blood-pressure reading, without diagnosis of hypertension: Secondary | ICD-10-CM | POA: Insufficient documentation

## 2018-03-18 DIAGNOSIS — Z7689 Persons encountering health services in other specified circumstances: Secondary | ICD-10-CM

## 2018-03-18 DIAGNOSIS — N400 Enlarged prostate without lower urinary tract symptoms: Secondary | ICD-10-CM | POA: Diagnosis not present

## 2018-03-18 DIAGNOSIS — N433 Hydrocele, unspecified: Secondary | ICD-10-CM | POA: Diagnosis not present

## 2018-03-18 DIAGNOSIS — Z0000 Encounter for general adult medical examination without abnormal findings: Secondary | ICD-10-CM | POA: Insufficient documentation

## 2018-03-18 DIAGNOSIS — N50812 Left testicular pain: Secondary | ICD-10-CM

## 2018-03-18 LAB — COMPREHENSIVE METABOLIC PANEL
ALT: 23 U/L (ref 0–53)
AST: 14 U/L (ref 0–37)
Albumin: 4.2 g/dL (ref 3.5–5.2)
Alkaline Phosphatase: 49 U/L (ref 39–117)
BUN: 10 mg/dL (ref 6–23)
CO2: 27 mEq/L (ref 19–32)
Calcium: 9.6 mg/dL (ref 8.4–10.5)
Chloride: 105 mEq/L (ref 96–112)
Creatinine, Ser: 0.95 mg/dL (ref 0.40–1.50)
GFR: 104.72 mL/min (ref >60.00)
Glucose, Bld: 105 mg/dL — ABNORMAL HIGH (ref 70–99)
Potassium: 4.1 mEq/L (ref 3.5–5.1)
Sodium: 140 mEq/L (ref 135–145)
Total Bilirubin: 0.5 mg/dL (ref 0.2–1.2)
Total Protein: 7 g/dL (ref 6.0–8.3)

## 2018-03-18 LAB — B12 AND FOLATE PANEL
Folate: 10.2 ng/mL (ref >5.9)
Vitamin B-12: 483 pg/mL (ref 211–911)

## 2018-03-18 LAB — CBC WITH DIFFERENTIAL/PLATELET
Basophils Absolute: 0.1 10*3/uL (ref 0.0–0.1)
Basophils Relative: 1 % (ref 0.0–3.0)
Eosinophils Absolute: 0.4 10*3/uL (ref 0.0–0.7)
Eosinophils Relative: 5.1 % — ABNORMAL HIGH (ref 0.0–5.0)
HCT: 44.3 % (ref 39.0–52.0)
Hemoglobin: 15 g/dL (ref 13.0–17.0)
Lymphocytes Relative: 30.4 % (ref 12.0–46.0)
Lymphs Abs: 2.3 10*3/uL (ref 0.7–4.0)
MCHC: 33.9 g/dL (ref 30.0–36.0)
MCV: 85.1 fl (ref 78.0–100.0)
Monocytes Absolute: 0.5 10*3/uL (ref 0.1–1.0)
Monocytes Relative: 6.8 % (ref 3.0–12.0)
Neutro Abs: 4.3 10*3/uL (ref 1.4–7.7)
Neutrophils Relative %: 56.7 % (ref 43.0–77.0)
Platelets: 274 10*3/uL (ref 150.0–400.0)
RBC: 5.2 Mil/uL (ref 4.22–5.81)
RDW: 12.9 % (ref 11.5–15.5)
WBC: 7.5 10*3/uL (ref 4.0–10.5)

## 2018-03-18 LAB — LIPID PANEL
Cholesterol: 167 mg/dL (ref 0–200)
HDL: 37 mg/dL — ABNORMAL LOW (ref >39.00)
LDL Cholesterol: 119 mg/dL — ABNORMAL HIGH (ref 0–99)
NonHDL: 130.28
Total CHOL/HDL Ratio: 5
Triglycerides: 56 mg/dL (ref 0.0–149.0)
VLDL: 11.2 mg/dL (ref 0.0–40.0)

## 2018-03-18 LAB — URINALYSIS, ROUTINE W REFLEX MICROSCOPIC
Bilirubin Urine: NEGATIVE
Hgb urine dipstick: NEGATIVE
Ketones, ur: NEGATIVE
Leukocytes, UA: NEGATIVE
Nitrite: NEGATIVE
RBC / HPF: NONE SEEN (ref 0–?)
Specific Gravity, Urine: 1.025 (ref 1.000–1.030)
Total Protein, Urine: NEGATIVE
Urine Glucose: NEGATIVE
Urobilinogen, UA: 0.2 (ref 0.0–1.0)
pH: 6 (ref 5.0–8.0)

## 2018-03-18 LAB — PSA: PSA: 0.24 ng/mL (ref 0.10–4.00)

## 2018-03-18 LAB — TSH: TSH: 3.39 u[IU]/mL (ref 0.35–4.50)

## 2018-03-18 LAB — VITAMIN D 25 HYDROXY (VIT D DEFICIENCY, FRACTURES): VITD: 15.35 ng/mL — ABNORMAL LOW (ref 30.00–100.00)

## 2018-03-18 LAB — HEMOGLOBIN A1C: Hgb A1c MFr Bld: 6 % (ref 4.6–6.5)

## 2018-03-18 MED ORDER — LEVOFLOXACIN 500 MG PO TABS: 500.0000 mg | ORAL_TABLET | Freq: Every day | ORAL | 0 refills | Status: DC

## 2018-03-18 NOTE — Telephone Encounter (Signed)
Victorino Dike from [facility] [place] called patient [place] results are available in chart.

## 2018-03-18 NOTE — Assessment & Plan Note (Signed)
Elevated today. This is atypical for him. Patient will keep a log at home and we will go from there.

## 2018-03-18 NOTE — Patient Instructions (Addendum)
Monitor blood pressure,  Goal is less than 120/80, based on newest guidelines; if persistently higher, please make sooner follow up appointment so we can recheck you blood pressure and manage medications. Call me with these readings.   Start levofloxacin for empiric treatment of epididymitis.  Scrotal ultrasound today  Labs today   Epididymitis  Epididymitis is swelling (inflammation) of the epididymis. The epididymis is a cord-like structure that is located along the top and back part of the testicle. It collects and stores sperm from the testicle. This condition can also cause pain and swelling of the testicle and scrotum. Symptoms usually start suddenly (acute epididymitis). Sometimes epididymitis starts gradually and lasts for a while (chronic epididymitis). This type may be harder to treat. What are the causes? In men 27 and younger, this condition is usually caused by a bacterial infection or sexually transmitted disease (STD), such as:  Gonorrhea.  Chlamydia. In men 61 and older who do not have anal sex, this condition is usually caused by bacteria from a blockage or abnormalities in the urinary system. These can result from:  Having a tube placed into the bladder (urinary catheter).  Having an enlarged or inflamed prostate gland.  Having recent urinary tract surgery. In men who have a condition that weakens the body's defense system (immune system), such as HIV, this condition can be caused by:  Other bacteria, including tuberculosis and syphilis.  Viruses.  Fungi. Sometimes this condition occurs without infection. That may happen if urine flows backward into the epididymis after heavy lifting or straining. What increases the risk? This condition is more likely to develop in men:  Who have unprotected sex with more than one partner.  Who have anal sex.  Who have recently had surgery.  Who have a urinary catheter.  Who have urinary problems.  Who have a  suppressed immune system. What are the signs or symptoms? This condition usually begins suddenly with chills, fever, and pain behind the scrotum and in the testicle. Other symptoms include:  Swelling of the scrotum, testicle, or both.  Pain whenejaculatingor urinating.  Pain in the back or belly.  Nausea.  Itching and discharge from the penis.  Frequent need to pass urine.  Redness and tenderness of the scrotum. How is this diagnosed? Your health care provider can diagnose this condition based on your symptoms and medical history. Your health care provider will also do a physical exam to ask about your symptoms and check your scrotum and testicle for swelling, pain, and redness. You may also have other tests, including:  Examination of discharge from the penis.  Urine tests for infections, such as STDs. Your health care provider may test you for other STDs, including HIV. How is this treated? Treatment for this condition depends on the cause. If your condition is caused by a bacterial infection, oral antibiotic medicine may be prescribed. If the bacterial infection has spread to your blood, you may need to receive IV antibiotics. Nonbacterial epididymitis is treated with home care that includes bed rest and elevation of the scrotum. Surgery may be needed to treat:  Bacterial epididymitis that causes pus to build up in the scrotum (abscess).  Chronic epididymitis that has not responded to other treatments. Follow these instructions at home: Medicines  Take over-the-counter and prescription medicines only as told by your health care provider.  If you were prescribed an antibiotic medicine, take it as told by your health care provider. Do not stop taking the antibiotic even if your condition  improves. Sexual Activity  If your epididymitis was caused by an STD, avoid sexual activity until your treatment is complete.  Inform your sexual partner or partners if you test positive  for an STD. They may need to be treated.Do not engage in sexual activity with your partner or partners until their treatment is completed. General instructions  Return to your normal activities as told by your health care provider. Ask your health care provider what activities are safe for you.  Keep your scrotum elevated and supported while resting. Ask your health care provider if you should wear a scrotal support, such as a jockstrap. Wear it as told by your health care provider.  If directed, apply ice to the affected area: ? Put ice in a plastic bag. ? Place a towel between your skin and the bag. ? Leave the ice on for 20 minutes, 2-3 times per day.  Try taking a sitz bath to help with discomfort. This is a warm water bath that is taken while you are sitting down. The water should only come up to your hips and should cover your buttocks. Do this 3-4 times per day or as told by your health care provider.  Keep all follow-up visits as told by your health care provider. This is important. Contact a health care provider if:  You have a fever.  Your pain medicine is not helping.  Your pain is getting worse.  Your symptoms do not improve within three days. This information is not intended to replace advice given to you by your health care provider. Make sure you discuss any questions you have with your health care provider. Document Released: 02/02/2000 Document Revised: 07/13/2015 Document Reviewed: 06/22/2014 Elsevier Interactive Patient Education  2019 [employer]   Spermatocele  A spermatocele is a fluid-filled sac (cyst) inside the scrotum. This type of cyst often forms at the top of the testicle where sperm is stored (epididymis). The cyst sometimes forms along the tube that carries sperm away from the epididymis (vas deferens). Spermatoceles are usually painless. Most cysts are small, but they can grow larger. Spermatoceles are not cancerous (are benign). What are the  causes? The cause of this condition is not known. What are the signs or symptoms? In most cases, small cysts do not cause symptoms. If you do have symptoms, they may include:  Dull pain.  A feeling of heaviness.  An enlargement of your scrotum, if the cyst is large. How is this diagnosed? This condition is diagnosed based on a physical exam. You or your health care provider may notice the cyst when feeling your scrotum. Your provider may shine a light through (transilluminate) your scrotum to see if light will pass through the cyst. You may also have an ultrasound of your scrotum to rule out a tumor. How is this treated? Small spermatoceles do not need to be treated. If the spermatocele has grown large or is uncomfortable, surgery to remove the cyst may be recommended. Follow these instructions at home:  Watch your spermatocele for any changes.  Keep all follow-up visits as told by your health care provider. This is important. Contact a health care provider if:  Your spermatocele gets larger.  You have pain in your scrotum.  Your spermatocele comes back after treatment. This information is not intended to replace advice given to you by your health care provider. Make sure you discuss any questions you have with your health care provider. Document Released: 05/29/2015 Document Revised: 10/02/2015 Document Reviewed: 02/19/2015  Chartered certified accountant Patient Education  [employer].

## 2018-03-18 NOTE — Progress Notes (Signed)
Subjective:    Patient ID: Gary Pace, male    DOB: July 20, 1975, 43 y.o.   MRN: [id number]  CC: Gary Pace is a 43 y.o. male who presents today to establish care.    HPI: Complains of left testicular pain for past couple of weeks, notices when sitting and standing for long periods.  Ranges from ache to severe pain.   not particularly relieved with rest, elevation.  Typically goes away "on its own".  This has been going for years, left sided, however more noticeable past couple of weeks.   Comes and goes. Not worsening.   No unusual penile discharge, rash, scrotal swelling, dysuria, urinary frequency, urinary hesistancy.   No concerns for STDs. No new partner.   H/o enlarged prostate. Had been seeing urology, Dr Achilles Dunk, annually.   Works at [employer].   Low HR - 'normal for him' . Denies exertional chest pain or pressure, numbness or tingling radiating to left arm or jaw, palpitations, dizziness, frequent headaches, changes in vision, or shortness of breath.       Urology- 01/2017- chronic prostatitis, ED, speramtocele- had been on sildenafil, flomax in the past, prescribed uroxatral at last visit ; no longer using any medications per patient.   HR 55 01/2017.    HISTORY:  Past Medical History:  Diagnosis Date  . Enlarged prostate    Followed by Dr. Achilles Dunk   Past Surgical History:  Procedure Laterality Date  . CARPAL TUNNEL RELEASE Right    [facility]  . TONSILLECTOMY AND ADENOIDECTOMY  2008   Family History  Problem Relation Age of Onset  . Thyroid disease Brother   . Cancer Paternal Grandfather 35       prostate cancer  . Stroke Paternal Grandfather   . Diabetes Paternal Grandfather   . Cancer Paternal Aunt        breast, brain  . Stroke Paternal Aunt     Allergies: Patient has no known allergies. Current Outpatient Medications on File Prior to Visit  Medication Sig Dispense Refill  . fluticasone (FLONASE) 50 MCG/ACT nasal spray Place into both nostrils daily as  needed for allergies or rhinitis.    Marland Kitchen loratadine (CLARITIN) 10 MG tablet Take 10 mg by mouth daily as needed for allergies.     No current facility-administered medications on file prior to visit.     Social History   Tobacco Use  . Smoking status: Never Smoker  . Smokeless tobacco: Never Used  Substance Use Topics  . Alcohol use: No    Alcohol/week: 0.0 standard drinks  . Drug use: No    Review of Systems  Constitutional: Negative for chills and fever.  Respiratory: Negative for cough.   Cardiovascular: Negative for chest pain and palpitations.  Gastrointestinal: Negative for abdominal pain, nausea and vomiting.  Genitourinary: Positive for testicular pain. Negative for difficulty urinating, discharge, enuresis, genital sores, hematuria, penile swelling, scrotal swelling and urgency.      Objective:    BP 140/84 (BP Location: Left Arm, Patient Position: Sitting, Cuff Size: Large)   Pulse (!) 56   Temp 97.9 F (36.6 C) (Oral)   Resp 18   Ht 5\' 8"  (1.727 m)   Wt 238 lb 2 oz (108 kg)   SpO2 97%   BMI 36.21 kg/m  BP Readings from Last 3 Encounters:  03/18/18 140/84  02/06/17 104/62  11/14/16 120/78   Wt Readings from Last 3 Encounters:  03/18/18 238 lb 2 oz (108 kg)  02/06/17 234  lb (106.1 kg)  11/14/16 228 lb 6.4 oz (103.6 kg)    Physical Exam Vitals signs reviewed.  Constitutional:      Appearance: He is well-developed.  Cardiovascular:     Rate and Rhythm: Regular rhythm.     Heart sounds: Normal heart sounds.  Pulmonary:     Effort: Pulmonary effort is normal. No respiratory distress.     Breath sounds: Normal breath sounds. No wheezing, rhonchi or rales.  Genitourinary:    Penis: No tenderness, discharge, swelling or lesions.      Scrotum/Testes:        Right: Mass, tenderness or swelling not present.        Left: Mass, tenderness or swelling not present.     Epididymis:     Right: Normal. No mass or tenderness.     Left: Normal. No mass or  tenderness.     Comments: Cremasteric reflex present bilaterally. No blue dot.  Skin:    General: Skin is warm and dry.  Neurological:     Mental Status: He is alert.  Psychiatric:        Speech: Speech normal.        Behavior: Behavior normal.        Assessment & Plan:   Problem List Items Addressed This Visit      Other   Enlarged prostate    History of.   poor prostate exam based on body habitus, pending PSA, referral back to urology for continued annual surveillance.      Testicular pain, left - Primary    Patient well-appearing, nontoxic in appearance.  He is afebrile .  Benign exam . no pain today.  differentials include spermatocele, epididymitis.  Patient feels pain has changed in the past 2 weeks, does report at times it to be more severe than a dull ache ( which I would have have suspected with a spermatocele), will go ahead and start empiric antibiotic therapy for epididymitis .  Advised probiotics.  Pending labs, urine studies, US.        Relevant Medications   levofloxacin (LEVAQUIN) 500 MG tablet   Other Relevant Orders   PSA   Ambulatory referral to Urology   Testosterone , Free and Total   Urine Culture   Urinalysis, Routine w reflex microscopic   Urine cytology ancillary only([place])   US SCROTUM W/DOPPLER   Elevated blood pressure reading    Elevated today. This is atypical for him. Patient will keep a log at home and we will go from there.       Encounter to establish care    Pending  Labs.       Relevant Orders   B12 and Folate Panel   CBC with Differential/Platelet   Comprehensive metabolic panel   TSH   Hemoglobin A1c   Lipid panel   VITAMIN D 25 Hydroxy (Vit-D Deficiency, Fractures)   HIV Antibody (routine testing w rflx)       I have discontinued Fayrene FearingJames B. Dunne's amoxicillin-clavulanate. I am also having him start on levofloxacin. Additionally, I am having him maintain his fluticasone and loratadine.   Meds ordered this  encounter  Medications  . levofloxacin (LEVAQUIN) 500 MG tablet    Sig: Take 1 tablet (500 mg total) by mouth daily.    Dispense:  10 tablet    Refill:  0    Order Specific Question:   Supervising Provider    Answer:   Duncan DullULLO, TERESA L [[id number]]  Return precautions given.   Risks, benefits, and alternatives of the medications and treatment plan prescribed today were discussed, and patient expressed understanding.   Education regarding symptom management and diagnosis given to patient on AVS.  Continue to follow with Tommie Samsook, Jayce G, DO for routine health maintenance.   Ethelene HalJames B Yeomans and I agreed with plan.   Rennie PlowmanMargaret Reymond Maynez, FNP

## 2018-03-18 NOTE — Assessment & Plan Note (Addendum)
Patient well-appearing, nontoxic in appearance.  He is afebrile .  Benign exam . no pain today.  differentials include spermatocele, epididymitis.  Patient feels pain has changed in the past 2 weeks, does report at times it to be more severe than a dull ache ( which I would have have suspected with a spermatocele), will go ahead and start empiric antibiotic therapy for epididymitis .  Advised probiotics.  Pending labs, urine studies, [place].

## 2018-03-18 NOTE — Assessment & Plan Note (Signed)
History of.   poor prostate exam based on body habitus, pending PSA, referral back to urology for continued annual surveillance.

## 2018-03-18 NOTE — Assessment & Plan Note (Signed)
Pending  Labs.  

## 2018-03-19 ENCOUNTER — Encounter: Payer: BLUE CROSS/BLUE SHIELD | Admitting: Family Medicine

## 2018-03-19 LAB — URINE CULTURE
MICRO NUMBER:: 121813
Result:: NO GROWTH
SPECIMEN QUALITY:: ADEQUATE

## 2018-03-19 LAB — URINE CYTOLOGY ANCILLARY ONLY
Chlamydia: NEGATIVE
Neisseria Gonorrhea: NEGATIVE
Trichomonas: NEGATIVE

## 2018-03-22 LAB — HIV ANTIBODY (ROUTINE TESTING W REFLEX): HIV 1&2 Ab, 4th Generation: NONREACTIVE

## 2018-03-22 LAB — TESTOSTERONE, FREE & TOTAL
Free Testosterone: 72.8 pg/mL (ref 35.0–155.0)
Testosterone, Total, LC-MS-MS: 307 ng/dL (ref 250–1100)

## 2018-04-15 ENCOUNTER — Ambulatory Visit: Payer: Self-pay | Admitting: Urology

## 2018-04-29 ENCOUNTER — Encounter: Payer: Self-pay | Admitting: Urology

## 2018-04-29 ENCOUNTER — Ambulatory Visit: Payer: BLUE CROSS/BLUE SHIELD | Admitting: Urology

## 2018-04-29 ENCOUNTER — Other Ambulatory Visit: Payer: Self-pay

## 2018-04-29 ENCOUNTER — Ambulatory Visit: Payer: Self-pay | Admitting: Urology

## 2018-04-29 VITALS — BP 123/70 | HR 53 | Ht 68.0 in | Wt 239.2 lb

## 2018-04-29 DIAGNOSIS — N50819 Testicular pain, unspecified: Secondary | ICD-10-CM

## 2018-04-29 DIAGNOSIS — R3915 Urgency of urination: Secondary | ICD-10-CM

## 2018-04-29 NOTE — Patient Instructions (Signed)
Testicular Self-Exam  A self-exam of your testicles (testicular self-exam) is looking at and feeling your testicles for unusual lumps or swelling. Swelling, lumps, or pain can be caused by:  · Injuries.  · Puffiness, redness, and soreness (inflammation).  · Infection.  · Extra fluids around your testicle (hydrocele).  · Twisted testicles (testicular torsion).  · Cancer of the testicle (testicular cancer).  Why is it important to do a self-exam of testicles?  You may need to do self-exams if you are at risk for cancer of the testicles. You may be at risk if you have:  · A testicle that has not descended (cryptorchidism).  · A history of cancer of the testicle.  · A family history of cancer of the testicle.  How to do a self-exam of testicles  It is easiest to do a self-exam after a warm bath or shower. Testicles are harder to examine when you are cold.  A normal testicle is egg-shaped and feels firm. It is smooth, and it is not tender. At the back of your testicles, there is a firm cord that feels like spaghetti (spermatic cord).  Look and feel for changes  · Stand and hold your penis away from your body.  · Look at each testicle to check for lumps or swelling.  · Roll each testicle between your thumb and finger. Feel the whole testicle. Feel for:  ? Lumps.  ? Swelling.  ? Discomfort.  · Check for swelling or tender bumps in the groin area. Your groin is where your lower belly (abdomen) meets your upper thighs.  Contact a health care provider if:  · You find a bump or lump. This may be like a small, hard bump that is the size of a pea.  · You find swelling.  · You find pain.  · You find soreness.  · You see or feel any other changes.  Summary  · A self-exam of your testicles is looking at and feeling your testicles for lumps or swelling.  · You may need to do self-exams if you are at risk for cancer of the testicle.  · You should check each of your testicles for lumps, swelling, or discomfort.  · You should check for  swelling or tender bumps in the groin area. Your groin is where your lower belly (abdomen) meets your upper thighs.  This information is not intended to replace advice given to you by your health care provider. Make sure you discuss any questions you have with your health care provider.  Document Released: 05/03/2008 Document Revised: 01/01/2016 Document Reviewed: 01/01/2016  Elsevier Interactive Patient Education © 2019 Elsevier Inc[employer]

## 2018-04-29 NOTE — Progress Notes (Signed)
04/29/2018 2:05 PM   Gary Pace 06-Nov-1975 [id number]  Referring provider: Allegra Grana, FNP [address] [place], [place] [address]  Chief Complaint  Patient presents with  . Testicle Pain    HPI:  [name] seen today for several issues.  He has longstanding pelvic and testicular pain.  He previously saw Dr. Achilles Pace.  A scrotal ultrasound was done January 2020 which was normal.    He has a history of BPH, urgency and nocturia.  He was treated with alfuzosin.  Post void was 0.  He has a history of erectile dysfunction and has taken sildenafil.  His testosterone was 307 and PSA 0.24 in 2019.  Negative urinalysis.  Today, he is well. Voiding adequately. He lifts radiators at work. No gross hematuria. He doesn't need sildenafil every time.   Modifying factors: There are no other modifying factors  Associated signs and symptoms: There are no other associated signs and symptoms Aggravating and relieving factors: There are no other aggravating or relieving factors Severity: Moderate Duration: Persistent    PMH: Past Medical History:  Diagnosis Date  . Enlarged prostate    Followed by Dr. Achilles Pace    Surgical History: Past Surgical History:  Procedure Laterality Date  . CARPAL TUNNEL RELEASE Right    Gary Pace  . TONSILLECTOMY AND ADENOIDECTOMY  2008    Home Medications:  Allergies as of 04/29/2018   No Known Allergies     Medication List       Accurate as of April 29, 2018  2:05 PM. Always use your most recent med list.        fluticasone 50 MCG/ACT nasal spray Commonly known as:  FLONASE Place into both nostrils daily as needed for allergies or rhinitis.   loratadine 10 MG tablet Commonly known as:  CLARITIN Take 10 mg by mouth daily as needed for allergies.   sildenafil 20 MG tablet Commonly known as:  REVATIO 3-5 tablets po daily as needed       Allergies: No Known Allergies  Family History: Family History  Problem Relation  Age of Onset  . Thyroid disease Brother   . Cancer Paternal Grandfather 37       prostate cancer  . Stroke Paternal Grandfather   . Diabetes Paternal Grandfather   . Cancer Paternal Aunt        breast, brain  . Stroke Paternal Aunt     Social History:  reports that he has never smoked. He has never used smokeless tobacco. He reports that he does not drink alcohol or use drugs.  ROS: UROLOGY Frequent Urination?: No Hard to postpone urination?: No Burning/pain with urination?: No Get up at night to urinate?: No Leakage of urine?: No Urine stream starts and stops?: No Trouble starting stream?: No Do you have to strain to urinate?: No Blood in urine?: No Urinary tract infection?: No Sexually transmitted disease?: No Injury to kidneys or bladder?: No Painful intercourse?: No Weak stream?: No Erection problems?: No Penile pain?: No  Gastrointestinal Nausea?: No Vomiting?: No Indigestion/heartburn?: No Diarrhea?: No Constipation?: No  Constitutional Fever: No Night sweats?: No Weight loss?: No Fatigue?: No  Skin Skin rash/lesions?: No Itching?: No  Eyes Blurred vision?: No Double vision?: No  Ears/Nose/Throat Sore throat?: No Sinus problems?: No  Hematologic/Lymphatic Swollen glands?: No Easy bruising?: No  Cardiovascular Leg swelling?: No Chest pain?: No  Respiratory Cough?: No Shortness of breath?: No  Endocrine Excessive thirst?: No  Musculoskeletal Back pain?: No Joint  pain?: No  Neurological Headaches?: No Dizziness?: No  Psychologic Depression?: No Anxiety?: No  Physical Exam: BP 123/70 (BP Location: Left Arm, Patient Position: Sitting, Cuff Size: Large)   Pulse (!) 53   Ht 5\' 8"  (1.727 m)   Wt 108.5 kg   BMI 36.37 kg/m   Constitutional:  Alert and oriented, No acute distress. HEENT: [place] AT, moist mucus membranes.  Trachea midline, no masses. Cardiovascular: No clubbing, cyanosis, or edema. Respiratory: Normal respiratory  effort, no increased work of breathing. GI: Abdomen is soft, nontender, nondistended, no abdominal masses GU: No CVA tenderness Lymph: No cervical or inguinal lymphadenopathy. Skin: No rashes, bruises or suspicious lesions. Neurologic: Grossly intact, no focal deficits, moving all 4 extremities. Psychiatric: Normal mood and affect. GU: Penis circumcised, normal foreskin, testicles descended bilaterally and palpably normal, bilateral epididymis palpably normal, scrotum normal DRE: Prostate 20 g, smooth without hard area or nodule    Laboratory Data: Lab Results  Component Value Date   WBC 7.5 03/18/2018   HGB 15.0 03/18/2018   HCT 44.3 03/18/2018   MCV 85.1 03/18/2018   PLT 274.0 03/18/2018    Lab Results  Component Value Date   CREATININE 0.95 03/18/2018    Lab Results  Component Value Date   PSA 0.24 03/18/2018   PSA 0.25 07/24/2015   PSA 0.23 05/25/2012    No results found for: TESTOSTERONE  Lab Results  Component Value Date   HGBA1C 6.0 03/18/2018    Urinalysis    Component Value Date/Time   COLORURINE YELLOW 03/18/2018 0909   APPEARANCEUR CLEAR 03/18/2018 0909   LABSPEC 1.025 03/18/2018 0909   PHURINE 6.0 03/18/2018 0909   GLUCOSEU NEGATIVE 03/18/2018 0909   HGBUR NEGATIVE 03/18/2018 0909   BILIRUBINUR NEGATIVE 03/18/2018 0909   KETONESUR NEGATIVE 03/18/2018 0909   UROBILINOGEN 0.2 03/18/2018 0909   NITRITE NEGATIVE 03/18/2018 0909   LEUKOCYTESUR NEGATIVE 03/18/2018 0909    No results found for: LABMICR, WBCUA, RBCUA, LABEPIT, MUCUS, BACTERIA  Pertinent Imaging: Scrotal US  No results found for this or any previous visit. No results found for this or any previous visit. No results found for this or any previous visit. No results found for this or any previous visit. No results found for this or any previous visit. No results found for this or any previous visit. No results found for this or any previous visit. No results found for this or any  previous visit.  Assessment & Plan:    Testicle pain -he had a normal exam today.  Testicles were easy to palpate.  We discussed importance of continued monthly self testicular exams.  I gave him instructions.  ED -stable.  Urgency -stable.  Normal DRE.  Small prostate.  See in 1 year for repeat exam and PSA.  Sooner if he has any issues.  No follow-ups on file.  Jerilee Field, MD  [facility] [address], [address] [place], [place] [address] [phone]

## 2018-07-22 DIAGNOSIS — R1310 Dysphagia, unspecified: Secondary | ICD-10-CM | POA: Diagnosis not present

## 2018-08-03 DIAGNOSIS — Z1159 Encounter for screening for other viral diseases: Secondary | ICD-10-CM | POA: Diagnosis not present

## 2018-08-07 ENCOUNTER — Encounter: Payer: Self-pay | Admitting: Family

## 2018-08-07 DIAGNOSIS — R1310 Dysphagia, unspecified: Secondary | ICD-10-CM | POA: Diagnosis not present

## 2018-08-07 DIAGNOSIS — K228 Other specified diseases of esophagus: Secondary | ICD-10-CM | POA: Diagnosis not present

## 2018-08-07 DIAGNOSIS — K449 Diaphragmatic hernia without obstruction or gangrene: Secondary | ICD-10-CM | POA: Diagnosis not present

## 2018-08-07 DIAGNOSIS — K222 Esophageal obstruction: Secondary | ICD-10-CM | POA: Diagnosis not present

## 2018-08-07 DIAGNOSIS — K200 Eosinophilic esophagitis: Secondary | ICD-10-CM | POA: Diagnosis not present

## 2018-09-28 DIAGNOSIS — K200 Eosinophilic esophagitis: Secondary | ICD-10-CM | POA: Diagnosis not present

## 2018-09-28 DIAGNOSIS — R1310 Dysphagia, unspecified: Secondary | ICD-10-CM | POA: Diagnosis not present

## 2018-11-09 ENCOUNTER — Telehealth: Payer: Self-pay

## 2018-11-09 DIAGNOSIS — R030 Elevated blood-pressure reading, without diagnosis of hypertension: Secondary | ICD-10-CM | POA: Diagnosis not present

## 2018-11-09 DIAGNOSIS — Z03818 Encounter for observation for suspected exposure to other biological agents ruled out: Secondary | ICD-10-CM | POA: Diagnosis not present

## 2018-11-09 NOTE — Telephone Encounter (Signed)
Received called from patient d/t being placed on contact tracing list in workplace. Currently exhibiting no signs or symptoms at this time. Patient requesting to be tested for COVID-19.

## 2018-11-11 ENCOUNTER — Telehealth: Payer: Self-pay

## 2018-11-11 NOTE — Telephone Encounter (Signed)
Follow up call to contact tracing

## 2018-12-02 ENCOUNTER — Ambulatory Visit: Payer: Self-pay

## 2018-12-02 ENCOUNTER — Other Ambulatory Visit: Payer: Self-pay

## 2018-12-02 VITALS — BP 132/74 | HR 44 | Resp 16 | Ht 66.0 in | Wt 225.0 lb

## 2018-12-02 DIAGNOSIS — Z008 Encounter for other general examination: Secondary | ICD-10-CM

## 2018-12-02 LAB — POCT LIPID PANEL
Glucose Fasting, POC: 87 mg/dL (ref 70–99)
HDL: 42
LDL: 41
Non-HDL: 81
TC/HDL: 2.9
TC: 123
TRG: 203

## 2018-12-02 NOTE — Progress Notes (Signed)
     Patient ID: Gary Pace, male    DOB: 26-Apr-1975, 43 y.o.   MRN: [id number]    Thank you!!  [place] Nurse Specialist [place]: [phone]  Cell:  [phone] Website: [email or web address]

## 2019-01-15 ENCOUNTER — Encounter: Payer: Self-pay | Admitting: Emergency Medicine

## 2019-01-15 ENCOUNTER — Other Ambulatory Visit: Payer: Self-pay

## 2019-01-15 ENCOUNTER — Ambulatory Visit
Admission: EM | Admit: 2019-01-15 | Discharge: 2019-01-15 | Disposition: A | Discharge status: Home / Self Care | Payer: BC Managed Care – PPO | Attending: Family Medicine | Admitting: Family Medicine

## 2019-01-15 DIAGNOSIS — M545 Low back pain, unspecified: Secondary | ICD-10-CM

## 2019-01-15 MED ORDER — MELOXICAM 15 MG PO TABS: 15.0000 mg | ORAL_TABLET | Freq: Every day | ORAL | 0 refills | Status: DC | PRN

## 2019-01-15 MED ORDER — CYCLOBENZAPRINE HCL 10 MG PO TABS: 10.0000 mg | ORAL_TABLET | Freq: Two times a day (BID) | ORAL | 0 refills | Status: DC | PRN

## 2019-01-15 NOTE — ED Triage Notes (Signed)
Patient states this morning he was trying to move a pool table and he felt a pull in his lower back.

## 2019-01-15 NOTE — ED Provider Notes (Signed)
[facility] ____________________________________________  Time seen: Approximately 3:58 PM  I have reviewed the triage vital signs and the nursing notes.   HISTORY  Chief Complaint Back Pain   HPI Gary Pace is a 43 y.o. male presenting for evaluation of low back pain.  Patient reports earlier this morning he was assisting to lift a pull table and states when he lifted up he felt a pull in his low back.  States history of similar with lifting and moving items.  Did take ibuprofen this morning but no resolution.  States pain is mostly with movement.  Denies pain radiation, paresthesias, urinary or bowel retention or incontinence, abdominal pain, fall or direct trauma.  Denies recent fever or sickness.  Denies other alleviating measures.  Reports otherwise doing well.    Past Medical History:  Diagnosis Date   Enlarged prostate    Followed by Dr. Achilles Dunk    Patient Active Problem List   Diagnosis Date Noted   Testicular pain, left 03/18/2018   Elevated blood pressure reading 03/18/2018   Encounter to establish care 03/18/2018   Abscess 11/02/2016   Seasonal allergies 07/02/2015   Enlarged prostate 05/25/2012    Past Surgical History:  Procedure Laterality Date   CARPAL TUNNEL RELEASE Right    [facility]   TONSILLECTOMY AND ADENOIDECTOMY  2008     No current facility-administered medications for this encounter.   Current Outpatient Medications:    cyclobenzaprine (FLEXERIL) 10 MG tablet, Take 1 tablet (10 mg total) by mouth 2 (two) times daily as needed for muscle spasms. Do not drive while taking as can cause drowsiness, Disp: 15 tablet, Rfl: 0   meloxicam (MOBIC) 15 MG tablet, Take 1 tablet (15 mg total) by mouth daily as needed., Disp: 10 tablet, Rfl: 0  Allergies Patient has no known allergies.  Family History  Problem Relation Age of Onset   Thyroid disease Brother    Cancer Paternal Grandfather 25       prostate cancer   Stroke  Paternal Grandfather    Diabetes Paternal Grandfather    Cancer Paternal Aunt        breast, brain   Stroke Paternal Aunt     Social History Social History   Tobacco Use   Smoking status: Never Smoker   Smokeless tobacco: Never Used  Substance Use Topics   Alcohol use: No    Alcohol/week: 0.0 standard drinks   Drug use: No    Review of Systems Constitutional: No fever Cardiovascular: Denies chest pain. Respiratory: Denies shortness of breath. Gastrointestinal: No abdominal pain.  No nausea, no vomiting.  No diarrhea.   Genitourinary: Negative for dysuria. Musculoskeletal: Positive for back pain. Skin: Negative for rash. Neurological: Negative for headaches, focal weakness or numbness.    ____________________________________________   PHYSICAL EXAM:  VITAL SIGNS: ED Triage Vitals  Enc Vitals Group     BP 01/15/19 1537 (!) 147/90     Pulse Rate 01/15/19 1537 60     Resp 01/15/19 1537 18     Temp 01/15/19 1537 98.1 F (36.7 C)     Temp Source 01/15/19 1537 Oral     SpO2 01/15/19 1537 99 %     Weight 01/15/19 1536 225 lb (102.1 kg)     Height 01/15/19 1536 5\' 6"  (1.676 m)     Head Circumference --      Peak Flow --      Pain Score 01/15/19 1535 5     Pain Loc --  Pain Edu? --      Excl. in [place]? --     Constitutional: Alert and oriented. Well appearing and in no acute distress. ENT      Head: Normocephalic and atraumatic. Cardiovascular: Normal rate, regular rhythm. Grossly normal heart sounds.  Good peripheral circulation. Respiratory: Normal respiratory effort without tachypnea nor retractions. Breath sounds are clear and equal bilaterally. No wheezes, rales, rhonchi. Gastrointestinal: Soft and nontender.  No CVA tenderness. Musculoskeletal: Steady gait.  No midline cervical or thoracic tenderness palpation.  Changes positions quickly in room. Except: Mild midline lower lumbar and bilateral paralumbar tenderness palpation, no swelling, no  ecchymosis, able to fully lumbar flex and extend as well as right and left rotate with mild pain, no pain with bilateral standing knee lifts, steady gait, no saddle anesthesia. Neurologic:  Normal speech and language. Speech is normal. No gait instability.  Skin:  Skin is warm, dry and intact. No rash noted. Psychiatric: Mood and affect are normal. Speech and behavior are normal. Patient exhibits appropriate insight and judgment   ___________________________________________   LABS (all labs ordered are listed, but only abnormal results are displayed)  Labs Reviewed - No data to display   PROCEDURES Procedures    INITIAL IMPRESSION / ASSESSMENT AND PLAN / ED COURSE  Pertinent labs & imaging results that were available during my care of the patient were reviewed by me and considered in my medical decision making (see chart for details).  Well-appearing patient.  No acute distress.  Suspect lumbar strain.  Will treat patient with oral Mobic and parent Flexeril.  Encouraged ice, supportive care.Discussed indication, risks and benefits of medications with patient.   Discussed follow up with Primary care physician this week as needed. Discussed follow up and return parameters including no resolution or any worsening concerns. Patient verbalized understanding and agreed to plan.   ____________________________________________   FINAL CLINICAL IMPRESSION(S) / ED DIAGNOSES  Final diagnoses:  Acute bilateral low back pain without sciatica     ED Discharge Orders         Ordered    meloxicam (MOBIC) 15 MG tablet  Daily PRN     01/15/19 1558    cyclobenzaprine (FLEXERIL) 10 MG tablet  2 times daily PRN     01/15/19 1558           Note: This dictation was prepared with Dragon dictation along with smaller phrase technology. Any transcriptional errors that result from this process are unintentional.         Marylene Land, NP 01/15/19 1621

## 2019-01-15 NOTE — Discharge Instructions (Addendum)
Take medication as prescribed. Rest. Drink plenty of fluids. Ice/Heat.  Follow up with your primary care physician this week as needed. Return to Urgent care for new or worsening concerns.   

## 2019-03-08 ENCOUNTER — Ambulatory Visit
Admission: EM | Admit: 2019-03-08 | Discharge: 2019-03-08 | Disposition: A | Discharge status: Home / Self Care | Payer: BC Managed Care – PPO | Attending: Urgent Care | Admitting: Urgent Care

## 2019-03-08 ENCOUNTER — Encounter: Payer: Self-pay | Admitting: Emergency Medicine

## 2019-03-08 ENCOUNTER — Other Ambulatory Visit: Payer: Self-pay

## 2019-03-08 ENCOUNTER — Other Ambulatory Visit: Payer: BC Managed Care – PPO

## 2019-03-08 DIAGNOSIS — U071 COVID-19: Secondary | ICD-10-CM

## 2019-03-08 DIAGNOSIS — Z20822 Contact with and (suspected) exposure to covid-19: Secondary | ICD-10-CM

## 2019-03-08 DIAGNOSIS — R197 Diarrhea, unspecified: Secondary | ICD-10-CM

## 2019-03-08 DIAGNOSIS — R0981 Nasal congestion: Secondary | ICD-10-CM | POA: Insufficient documentation

## 2019-03-08 DIAGNOSIS — R509 Fever, unspecified: Secondary | ICD-10-CM

## 2019-03-08 DIAGNOSIS — Z7189 Other specified counseling: Secondary | ICD-10-CM

## 2019-03-08 HISTORY — DX: COVID-19: U07.1

## 2019-03-08 LAB — SARS CORONAVIRUS 2 AG (30 MIN TAT): SARS Coronavirus 2 Ag: POSITIVE — AB

## 2019-03-08 NOTE — Discharge Instructions (Signed)
Over the counter medication as needed. Rest. Drink plenty of fluids.   Follow up with your primary care physician this week as needed. Return to Urgent care or ER for new or worsening concerns.

## 2019-03-08 NOTE — ED Provider Notes (Signed)
[facility] ____________________________________________  Time seen: Approximately 9:33 AM  I have reviewed the triage vital signs and the nursing notes.   HISTORY  Chief Complaint Diarrhea and Fever   HPI Gary Pace is a 44 y.o. male presenting for evaluation of nasal congestion, diarrhea and fever.  Reports this past Friday into Saturday he had some body aches and then had 2 episodes of diarrhea that was normal in color.  Denies any bloody diarrhea and states no longer having diarrhea.  States yesterday noticed diminished taste and smell as well as fever of 101.  Reports today has some nasal congestion.  Denies cough, chest pain, shortness of breath, sore throat.  Has taken some intermittent over-the-counter Tylenol which has helped.  Continues eat and drink well.  No vomiting.  Denies known direct sick contacts, but does report there has been cases of COVID-19 this past week at his work.  Reports otherwise doing well.  Denies recent sickness.   Past Medical History:  Diagnosis Date  . Enlarged prostate    Followed by Dr. Achilles Dunk    Patient Active Problem List   Diagnosis Date Noted  . Testicular pain, left 03/18/2018  . Elevated blood pressure reading 03/18/2018  . Encounter to establish care 03/18/2018  . Abscess 11/02/2016  . Seasonal allergies 07/02/2015  . Enlarged prostate 05/25/2012    Past Surgical History:  Procedure Laterality Date  . CARPAL TUNNEL RELEASE Right    [facility]  . TONSILLECTOMY AND ADENOIDECTOMY  2008     No current facility-administered medications for this encounter. No current outpatient medications on file.  Allergies Patient has no known allergies.  Family History  Problem Relation Age of Onset  . Thyroid disease Brother   . Cancer Paternal Grandfather 37       prostate cancer  . Stroke Paternal Grandfather   . Diabetes Paternal Grandfather   . Cancer Paternal Aunt        breast, brain  . Other Mother        unknown  medical history  . Other Father        unknown medical history  . Stroke Paternal Aunt     Social History Social History   Tobacco Use  . Smoking status: Never Smoker  . Smokeless tobacco: Never Used  Substance Use Topics  . Alcohol use: No    Alcohol/week: 0.0 standard drinks  . Drug use: No    Review of Systems Constitutional: Positive fever. ENT: No sore throat. As above.  Cardiovascular: Denies chest pain. Respiratory: Denies shortness of breath. Gastrointestinal: No abdominal pain.  As above.  Genitourinary: Negative for dysuria. Musculoskeletal: Negative for back pain. Skin: Negative for rash.   ____________________________________________   PHYSICAL EXAM:  VITAL SIGNS: ED Triage Vitals  Enc Vitals Group     BP 03/08/19 0923 131/79     Pulse Rate 03/08/19 0923 64     Resp 03/08/19 0923 18     Temp 03/08/19 0923 98.7 F (37.1 C)     Temp Source 03/08/19 0923 Oral     SpO2 03/08/19 0923 98 %     Weight 03/08/19 0925 225 lb (102.1 kg)     Height 03/08/19 0925 5\' 6"  (1.676 m)     Head Circumference --      Peak Flow --      Pain Score 03/08/19 0924 0     Pain Loc --      Pain Edu? --  Excl. in [place]? --     Constitutional: Alert and oriented. Well appearing and in no acute distress. Eyes: Conjunctivae are normal. Head: Atraumatic. No sinus tenderness to palpation. No swelling. No erythema.  Nose:Nasal congestion  Hematological/Lymphatic/Immunilogical: No cervical lymphadenopathy. Cardiovascular: Normal rate, regular rhythm. Grossly normal heart sounds.  Good peripheral circulation. Respiratory: Normal respiratory effort.  No retractions. No wheezes, rales or rhonchi. Good air movement.  Musculoskeletal: Ambulatory with steady gait.  Neurologic:  Normal speech and language. No gait instability. Skin:  Skin appears warm, dry and intact. No rash noted. Psychiatric: Mood and affect are normal. Speech and behavior are normal.   ___________________________________________   LABS (all labs ordered are listed, but only abnormal results are displayed)  Labs Reviewed  NOVEL CORONAVIRUS, NAA (HOSP ORDER, SEND-OUT TO REF LAB; TAT 18-24 HRS)  SARS CORONAVIRUS 2 AG (30 MIN TAT)    RADIOLOGY  No results found. ____________________________________________  INITIAL IMPRESSION / ASSESSMENT AND PLAN / ED COURSE  Pertinent labs & imaging results that were available during my care of the patient were reviewed by me and considered in my medical decision making (see chart for details).  Well-appearing patient.  No acute distress.  Suspect viral illness, suspected COVID-19.  COVID-19 rapid positive (patient informed by phone as he left prior to test result, and he verbalized understanding.).  COVID-19 advice given.  Encourage rest, fluids, supportive care and monitoring.  Work note given.  Discussed follow up and return parameters including no resolution or any worsening concerns. Patient verbalized understanding and agreed to plan.   ____________________________________________   FINAL CLINICAL IMPRESSION(S) / ED DIAGNOSES  Final diagnoses:  Diarrhea, unspecified type  Fever, unspecified  Advice given about COVID-19 virus infection     ED Discharge Orders    None       Note: This dictation was prepared with Dragon dictation along with smaller phrase technology. Any transcriptional errors that result from this process are unintentional.         Marylene Land, NP 03/08/19 1022

## 2019-03-08 NOTE — ED Triage Notes (Signed)
Patient in today c/o diarrhea on Saturday (03/06/19) and fever (101) Sunday (03/07/19) and change in taste and smell. Patient's last dose of Tylenol was ~5am this morning.

## 2019-03-09 ENCOUNTER — Telehealth: Payer: Self-pay | Admitting: Pulmonary Disease

## 2019-03-09 NOTE — Telephone Encounter (Signed)
03/09/2019    I was able to reach the patient and discuss his recent COVID-19 diagnosis.  I also discussed with him that he would qualify for the outpatient monoclonal antibody infusion based off his BMI.  Patient declines the outpatient monoclonal antibody infusion at this time.  I have left the telephone number: [phone] for the patient to call us back if they are interested.    Explained to patient he can also always utilize my chart if he has concerns about worsening symptoms.  Also reviewed severe worsening symptoms such as hypoxemia, chest pain, palpitations, increased shortness of breath and that patient should seek emergent evaluation at that time.  Patient reports that he feels clinically stable today.  Continues to have a fever, loss of taste.  Sinus symptoms have improved slightly as well as GI symptoms.  Will route to PCP as FYI.  Coral Ceo, NP

## 2019-03-10 ENCOUNTER — Encounter: Payer: Self-pay | Admitting: Family

## 2019-03-10 LAB — NOVEL CORONAVIRUS, NAA (HOSP ORDER, SEND-OUT TO REF LAB; TAT 18-24 HRS): SARS-CoV-2, NAA: DETECTED — AB

## 2019-03-10 NOTE — Telephone Encounter (Signed)
noted 

## 2019-03-11 ENCOUNTER — Other Ambulatory Visit: Payer: Self-pay | Admitting: Internal Medicine

## 2019-03-11 ENCOUNTER — Telehealth: Payer: Self-pay | Admitting: Family

## 2019-03-11 DIAGNOSIS — Z6835 Body mass index (BMI) 35.0-35.9, adult: Secondary | ICD-10-CM

## 2019-03-11 DIAGNOSIS — E669 Obesity, unspecified: Secondary | ICD-10-CM

## 2019-03-11 DIAGNOSIS — U071 COVID-19: Secondary | ICD-10-CM

## 2019-03-11 NOTE — Progress Notes (Signed)
  I connected by phone with Gary Pace on 03/11/2019 at 11:57 AM to discuss the potential use of an new treatment for mild to moderate COVID-19 viral infection in non-hospitalized patients.  This patient is a 44 y.o. male that meets the FDA criteria for Emergency Use Authorization of bamlanivimab or casirivimab\imdevimab.  Has a (+) direct SARS-CoV-2 viral test result  Has mild or moderate COVID-19   Is ? 44 years of age and weighs ? 40 kg  Is NOT hospitalized due to COVID-19  Is NOT requiring oxygen therapy or requiring an increase in baseline oxygen flow rate due to COVID-19  Is within 10 days of symptom onset  Has at least one of the high risk factor(s) for progression to severe COVID-19 and/or hospitalization as defined in EUA.  Specific high risk criteria : BMI >/= 35   I have spoken and communicated the following to the patient or parent/caregiver:  1. FDA has authorized the emergency use of bamlanivimab and casirivimab\imdevimab for the treatment of mild to moderate COVID-19 in adults and pediatric patients with positive results of direct SARS-CoV-2 viral testing who are 93 years of age and older weighing at least 40 kg, and who are at high risk for progressing to severe COVID-19 and/or hospitalization.  2. The significant known and potential risks and benefits of bamlanivimab and casirivimab\imdevimab, and the extent to which such potential risks and benefits are unknown.  3. Information on available alternative treatments and the risks and benefits of those alternatives, including clinical trials.  4. Patients treated with bamlanivimab and casirivimab\imdevimab should continue to self-isolate and use infection control measures (e.g., wear mask, isolate, social distance, avoid sharing personal items, clean and disinfect "high touch" surfaces, and frequent handwashing) according to CDC guidelines.   5. The patient or parent/caregiver has the option to accept or refuse  bamlanivimab or casirivimab\imdevimab .  After reviewing this information with the patient, The patient agreed to proceed with receiving the bamlanimivab infusion and will be provided a copy of the Fact sheet prior to receiving the infusion.Cyndee Brightly, NP-C [facility]  pgr [phone]

## 2019-03-11 NOTE — Telephone Encounter (Signed)
I am currently in clinic seeing patients on unable to contact the patient immediately.  I will route this to our team to see if somebody can contact him directly.  If not then I can speak with him at lunch.Elisha Headland, FNP

## 2019-03-11 NOTE — Telephone Encounter (Signed)
Patient was able to speak with Rennie Plowman, FNP via telephone.

## 2019-03-11 NOTE — Telephone Encounter (Signed)
Gary Pace,   Patient sent me a mychart and I  think he is reconsidering an infusion. He has quesitons about it and of course which monoclonal antibody therapy was offered to him. ( Im not sure which one he would potentially receive). I am happy to speak with him however I wanted to see if we can get into him into clinic today. I believe this is the last day he my qualify.  Maralyn Sago,  Would you call the [phone] number as well and ask per the above? Can we have clinic reach back out to patient to schedule as I do not want him miss the opportunity?  Will you reach out to patient and see what his symptoms are? I would advise consideration of infusion as with obesity raises risk of severe covid disease.   Give patient number to clinic as well.

## 2019-03-11 NOTE — Telephone Encounter (Signed)
Wendie Simmer,  I have NOT spoken with patient. I called and left a message asking him to call us and also sent him a mychart message . Did you speak with him ?

## 2019-03-11 NOTE — Telephone Encounter (Signed)
Pt's SO called and states that patient sent a mychart message about getting a covid infusion? And that this is the last day that he can receive it. Please call pt back to advise.

## 2019-03-11 NOTE — Telephone Encounter (Signed)
Patient has spoken with Rennie Plowman, FNP via telephone.

## 2019-03-11 NOTE — Telephone Encounter (Signed)
Gary Pace has now spoken with patient & he will be going for antibody infusion today.

## 2019-03-11 NOTE — Telephone Encounter (Signed)
Left detailed  message asking patient to call back.

## 2019-03-14 ENCOUNTER — Ambulatory Visit (HOSPITAL_COMMUNITY)
Admission: RE | Admit: 2019-03-14 | Discharge: 2019-03-14 | Disposition: A | Discharge status: Home / Self Care | Payer: BC Managed Care – PPO | Source: Ambulatory Visit | Attending: Pulmonary Disease | Admitting: Pulmonary Disease

## 2019-03-14 DIAGNOSIS — Z6835 Body mass index (BMI) 35.0-35.9, adult: Secondary | ICD-10-CM | POA: Insufficient documentation

## 2019-03-14 DIAGNOSIS — U071 COVID-19: Secondary | ICD-10-CM | POA: Diagnosis not present

## 2019-03-14 DIAGNOSIS — E669 Obesity, unspecified: Secondary | ICD-10-CM | POA: Insufficient documentation

## 2019-03-14 DIAGNOSIS — Z23 Encounter for immunization: Secondary | ICD-10-CM | POA: Diagnosis not present

## 2019-03-14 MED ORDER — EPINEPHRINE 0.3 MG/0.3ML IJ SOAJ: 0.3000 mg | Freq: Once | INTRAMUSCULAR | Status: DC | PRN

## 2019-03-14 MED ORDER — ALBUTEROL SULFATE HFA 108 (90 BASE) MCG/ACT IN AERS: 2.0000 | INHALATION_SPRAY | Freq: Once | RESPIRATORY_TRACT | Status: DC | PRN

## 2019-03-14 MED ORDER — SODIUM CHLORIDE 0.9 % IV SOLN
INTRAVENOUS | Status: DC | PRN
  Administered 2019-03-14: 250 mL via INTRAVENOUS

## 2019-03-14 MED ORDER — DIPHENHYDRAMINE HCL 50 MG/ML IJ SOLN: 50.0000 mg | Freq: Once | INTRAMUSCULAR | Status: DC | PRN

## 2019-03-14 MED ORDER — METHYLPREDNISOLONE SODIUM SUCC 125 MG IJ SOLR: 125.0000 mg | Freq: Once | INTRAMUSCULAR | Status: DC | PRN

## 2019-03-14 MED ORDER — SODIUM CHLORIDE 0.9 % IV SOLN
700.0000 mg | Freq: Once | INTRAVENOUS | Status: AC
  Administered 2019-03-14: 700 mg via INTRAVENOUS
  Filled 2019-03-14: qty 20

## 2019-03-14 MED ORDER — FAMOTIDINE IN NACL 20-0.9 MG/50ML-% IV SOLN: 20.0000 mg | Freq: Once | INTRAVENOUS | Status: DC | PRN

## 2019-03-14 NOTE — Progress Notes (Signed)
  Diagnosis: COVID-19  Physician: Dr. Wright  Procedure: Covid Infusion Clinic Med: bamlanivimab infusion - Provided patient with bamlanimivab fact sheet for patients, parents and caregivers prior to infusion.  Complications: No immediate complications noted.  Discharge: Discharged home   Tavoris Brisk S Jshaun Abernathy 03/14/2019  

## 2019-03-14 NOTE — Discharge Instructions (Signed)

## 2019-05-07 ENCOUNTER — Other Ambulatory Visit: Payer: Self-pay

## 2019-05-07 ENCOUNTER — Ambulatory Visit (INDEPENDENT_AMBULATORY_CARE_PROVIDER_SITE_OTHER): Payer: BC Managed Care – PPO | Admitting: Urology

## 2019-05-07 ENCOUNTER — Encounter: Payer: Self-pay | Admitting: Urology

## 2019-05-07 VITALS — BP 149/96 | HR 56 | Ht 66.0 in | Wt 223.0 lb

## 2019-05-07 DIAGNOSIS — N5201 Erectile dysfunction due to arterial insufficiency: Secondary | ICD-10-CM | POA: Diagnosis not present

## 2019-05-07 DIAGNOSIS — R350 Frequency of micturition: Secondary | ICD-10-CM

## 2019-05-07 MED ORDER — SILDENAFIL CITRATE 20 MG PO TABS: 20.0000 mg | ORAL_TABLET | Freq: Every day | ORAL | 6 refills | Status: DC | PRN

## 2019-05-07 NOTE — Progress Notes (Signed)
05/07/2019 2:11 PM   Gary Pace 1975/11/21 [id number]  Referring provider: Allegra Grana, FNP [address] [place],  [place] [address]  Chief Complaint  Patient presents with  . Follow-up    HPI:  F/u -   He has longstanding pelvic and testicular pain.  He previously saw Dr. Achilles Dunk.  A scrotal ultrasound was done January 2020 which was normal.    He has a history of BPH, urgency and nocturia.  He was treated with alfuzosin.  Post void was 0. PSA 0.23 in 2015, 0.25 in 2017. PSA 0.24 in 2020.   He has a history of erectile dysfunction and has taken sildenafil.  His testosterone was 307 in 2019.  Negative urinalysis.  Today, he is well. Voiding adequately. He lifts radiators at work. No gross hematuria. He doesn't need sildenafil every time.  He returns and is doing well. No voiding complaints. He doesn't need sildenafil every time. Doesn't take it "often". No gross hematuria or dysuria. His libido is good.    PMH: Past Medical History:  Diagnosis Date  . Enlarged prostate    Followed by Dr. Achilles Dunk    Surgical History: Past Surgical History:  Procedure Laterality Date  . CARPAL TUNNEL RELEASE Right    [facility]  . TONSILLECTOMY AND ADENOIDECTOMY  2008    Home Medications:  Allergies as of 05/07/2019   No Known Allergies     Medication List       Accurate as of May 07, 2019  2:11 PM. If you have any questions, ask your nurse or doctor.        pantoprazole 20 MG tablet Commonly known as: PROTONIX Take 20 mg by mouth daily.       Allergies: No Known Allergies  Family History: Family History  Problem Relation Age of Onset  . Thyroid disease Brother   . Cancer Paternal Grandfather 66       prostate cancer  . Stroke Paternal Grandfather   . Diabetes Paternal Grandfather   . Cancer Paternal Aunt        breast, brain  . Other Mother        unknown medical history  . Other Father        unknown medical history  . Stroke Paternal  Aunt     Social History:  reports that he has never smoked. He has never used smokeless tobacco. He reports that he does not drink alcohol or use drugs.   Physical Exam: BP (!) 149/96   Pulse (!) 56   Ht 5\' 6"  (1.676 m)   Wt 223 lb (101.2 kg)   BMI 35.99 kg/m   Constitutional:  Alert and oriented, No acute distress. HEENT: [place] AT, moist mucus membranes.  Trachea midline, no masses. Cardiovascular: No clubbing, cyanosis, or edema. Respiratory: Normal respiratory effort, no increased work of breathing. GI: Abdomen is soft, nontender, nondistended, no abdominal masses GU: No CVA tenderness Skin: No rashes, bruises or suspicious lesions. Neurologic: Grossly intact, no focal deficits, moving all 4 extremities. Psychiatric: Normal mood and affect.  Laboratory Data: Lab Results  Component Value Date   WBC 7.5 03/18/2018   HGB 15.0 03/18/2018   HCT 44.3 03/18/2018   MCV 85.1 03/18/2018   PLT 274.0 03/18/2018    Lab Results  Component Value Date   CREATININE 0.95 03/18/2018    Lab Results  Component Value Date   PSA 0.24 03/18/2018   PSA 0.25 07/24/2015   PSA 0.23 05/25/2012  No results found for: TESTOSTERONE  Lab Results  Component Value Date   HGBA1C 6.0 03/18/2018    Urinalysis    Component Value Date/Time   COLORURINE YELLOW 03/18/2018 0909   APPEARANCEUR CLEAR 03/18/2018 0909   LABSPEC 1.025 03/18/2018 0909   PHURINE 6.0 03/18/2018 0909   GLUCOSEU NEGATIVE 03/18/2018 0909   HGBUR NEGATIVE 03/18/2018 0909   BILIRUBINUR NEGATIVE 03/18/2018 0909   KETONESUR NEGATIVE 03/18/2018 0909   UROBILINOGEN 0.2 03/18/2018 0909   NITRITE NEGATIVE 03/18/2018 0909   LEUKOCYTESUR NEGATIVE 03/18/2018 0909    No results found for: LABMICR, [place], RBCUA, LABEPIT, MUCUS, BACTERIA  Pertinent Imaging: n/a No results found for this or any previous visit. No results found for this or any previous visit. No results found for this or any previous visit. No results found  for this or any previous visit. No results found for this or any previous visit. No results found for this or any previous visit. No results found for this or any previous visit. No results found for this or any previous visit.  Assessment & Plan:    LUTS - stable and improved for now . Decreased caffeine.   ED - sildenafil refilled   No follow-ups on file.  Festus Aloe, MD  [facility] [address], [place], [place] [address] [phone]

## 2019-05-31 ENCOUNTER — Encounter: Payer: Self-pay | Admitting: Family

## 2019-05-31 ENCOUNTER — Ambulatory Visit (INDEPENDENT_AMBULATORY_CARE_PROVIDER_SITE_OTHER): Payer: BC Managed Care – PPO

## 2019-05-31 ENCOUNTER — Other Ambulatory Visit: Payer: Self-pay

## 2019-05-31 ENCOUNTER — Ambulatory Visit (INDEPENDENT_AMBULATORY_CARE_PROVIDER_SITE_OTHER): Payer: BC Managed Care – PPO | Admitting: Family

## 2019-05-31 VITALS — BP 120/62 | HR 60 | Temp 97.1°F | Ht 67.5 in | Wt 232.2 lb

## 2019-05-31 DIAGNOSIS — Z125 Encounter for screening for malignant neoplasm of prostate: Secondary | ICD-10-CM | POA: Diagnosis not present

## 2019-05-31 DIAGNOSIS — Z0000 Encounter for general adult medical examination without abnormal findings: Secondary | ICD-10-CM | POA: Diagnosis not present

## 2019-05-31 DIAGNOSIS — I517 Cardiomegaly: Secondary | ICD-10-CM | POA: Diagnosis not present

## 2019-05-31 DIAGNOSIS — R0602 Shortness of breath: Secondary | ICD-10-CM | POA: Diagnosis not present

## 2019-05-31 LAB — LIPID PANEL
Cholesterol: 175 mg/dL (ref 0–200)
HDL: 39.6 mg/dL (ref >39.00)
LDL Cholesterol: 122 mg/dL — ABNORMAL HIGH (ref 0–99)
NonHDL: 135.88
Total CHOL/HDL Ratio: 4
Triglycerides: 67 mg/dL (ref 0.0–149.0)
VLDL: 13.4 mg/dL (ref 0.0–40.0)

## 2019-05-31 LAB — COMPREHENSIVE METABOLIC PANEL
ALT: 26 U/L (ref 0–53)
AST: 18 U/L (ref 0–37)
Albumin: 4.3 g/dL (ref 3.5–5.2)
Alkaline Phosphatase: 43 U/L (ref 39–117)
BUN: 11 mg/dL (ref 6–23)
CO2: 30 mEq/L (ref 19–32)
Calcium: 9.1 mg/dL (ref 8.4–10.5)
Chloride: 104 mEq/L (ref 96–112)
Creatinine, Ser: 0.91 mg/dL (ref 0.40–1.50)
GFR: 109.44 mL/min (ref >60.00)
Glucose, Bld: 76 mg/dL (ref 70–99)
Potassium: 3.9 mEq/L (ref 3.5–5.1)
Sodium: 140 mEq/L (ref 135–145)
Total Bilirubin: 0.6 mg/dL (ref 0.2–1.2)
Total Protein: 6.9 g/dL (ref 6.0–8.3)

## 2019-05-31 LAB — CBC WITH DIFFERENTIAL/PLATELET
Basophils Absolute: 0 10*3/uL (ref 0.0–0.1)
Basophils Relative: 0.6 % (ref 0.0–3.0)
Eosinophils Absolute: 0.4 10*3/uL (ref 0.0–0.7)
Eosinophils Relative: 5.1 % — ABNORMAL HIGH (ref 0.0–5.0)
HCT: 42.7 % (ref 39.0–52.0)
Hemoglobin: 14.2 g/dL (ref 13.0–17.0)
Lymphocytes Relative: 40.1 % (ref 12.0–46.0)
Lymphs Abs: 3.1 10*3/uL (ref 0.7–4.0)
MCHC: 33.3 g/dL (ref 30.0–36.0)
MCV: 86.9 fl (ref 78.0–100.0)
Monocytes Absolute: 0.6 10*3/uL (ref 0.1–1.0)
Monocytes Relative: 8.4 % (ref 3.0–12.0)
Neutro Abs: 3.5 10*3/uL (ref 1.4–7.7)
Neutrophils Relative %: 45.8 % (ref 43.0–77.0)
Platelets: 255 10*3/uL (ref 150.0–400.0)
RBC: 4.92 Mil/uL (ref 4.22–5.81)
RDW: 13.5 % (ref 11.5–15.5)
WBC: 7.6 10*3/uL (ref 4.0–10.5)

## 2019-05-31 MED ORDER — ALBUTEROL SULFATE HFA 108 (90 BASE) MCG/ACT IN AERS: 2.0000 | INHALATION_SPRAY | Freq: Four times a day (QID) | RESPIRATORY_TRACT | 2 refills | Status: DC | PRN

## 2019-05-31 NOTE — Progress Notes (Signed)
Subjective:    Patient ID: Gary Pace, male    DOB: 1975-03-23, 44 y.o.   MRN: [id number]  CC: Gary Pace is a 44 y.o. male who presents today for physical exam.    HPI:  Since covid, will have SOB 'once in a blue moon' with exertion. Started 3 months, unchanged.   no cp, leg swelling. Thinks that pollen and mowing grass contributory . On claritin.  Has been using son's albuterol inhaler, feels better No wheezing, cough, fever, congestion  GERD- on protonix prn.  No trouble or pain with swallowing    Enlarged prostate- Dr Gary Pace Colorectal  Cancer Screening: No early family history Prostate Cancer Screening: Has been discussed with patient;pending psa Lung Cancer Screening: No 30 year pack year history and > 55 years.  Immunizations       Tetanus - utd       Labs: Screening labs today. Exercise: Gets regular exercise, mowing grass. Denies exertional chest pain or pressure, numbness or tingling radiating to left arm or jaw, palpitations, dizziness, frequent headaches, changes in vision, or shortness of breath.    Alcohol use: none Smoking/tobacco use: Nonsmoker.    HISTORY:  Past Medical History:  Diagnosis Date  . Enlarged prostate    Followed by Dr. Achilles Pace    Past Surgical History:  Procedure Laterality Date  . CARPAL TUNNEL RELEASE Right    [facility]  . TONSILLECTOMY AND ADENOIDECTOMY  2008   Family History  Problem Relation Age of Onset  . Thyroid disease Brother   . Cancer Paternal Grandfather 2       prostate cancer  . Stroke Paternal Grandfather   . Diabetes Paternal Grandfather   . Cancer Paternal Aunt        breast, brain  . Other Mother        unknown medical history  . Other Father        unknown medical history  . Stroke Paternal Aunt       ALLERGIES: Patient has no known allergies.  Current Outpatient Medications on File Prior to Visit  Medication Sig Dispense Refill  . pantoprazole (PROTONIX) 20 MG tablet Take 20 mg by mouth daily.     . sildenafil (REVATIO) 20 MG tablet Take 1 tablet (20 mg total) by mouth daily as needed. Take 1-5 tabs about one hour before sexual activity 30 tablet 6  . [DISCONTINUED] fluticasone (FLONASE) 50 MCG/ACT nasal spray Place into both nostrils daily as needed for allergies or rhinitis.    . [DISCONTINUED] loratadine (CLARITIN) 10 MG tablet Take 10 mg by mouth daily as needed for allergies.     No current facility-administered medications on file prior to visit.    Social History   Tobacco Use  . Smoking status: Never Smoker  . Smokeless tobacco: Never Used  Substance Use Topics  . Alcohol use: No    Alcohol/week: 0.0 standard drinks  . Drug use: No    Review of Systems  Constitutional: Negative for chills and fever.  HENT: Negative for congestion.   Respiratory: Positive for shortness of breath. Negative for cough and wheezing.   Cardiovascular: Negative for chest pain, palpitations and leg swelling.  Gastrointestinal: Negative for diarrhea, nausea and vomiting.  Musculoskeletal: Negative for myalgias.  Skin: Negative for rash.  Neurological: Negative for headaches.  Hematological: Negative for adenopathy.  Psychiatric/Behavioral: Negative for confusion.      Objective:    BP 120/62   Pulse 60   Temp (!) 97.1  F (36.2 C) (Temporal)   Ht 5' 7.5" (1.715 m)   Wt 232 lb 3.2 oz (105.3 kg)   SpO2 97%   BMI 35.83 kg/m   BP Readings from Last 3 Encounters:  05/31/19 120/62  05/07/19 (!) 149/96  03/14/19 104/72   Wt Readings from Last 3 Encounters:  05/31/19 232 lb 3.2 oz (105.3 kg)  05/07/19 223 lb (101.2 kg)  03/08/19 225 lb (102.1 kg)    Physical Exam Vitals reviewed.  Constitutional:      Appearance: He is well-developed.  Neck:     Thyroid: No thyroid mass or thyromegaly.  Cardiovascular:     Rate and Rhythm: Regular rhythm.     Heart sounds: Normal heart sounds.  Pulmonary:     Effort: Pulmonary effort is normal. No respiratory distress.     Breath  sounds: Normal breath sounds. No wheezing, rhonchi or rales.  Lymphadenopathy:     Head:     Right side of head: No submental, submandibular, tonsillar, preauricular, posterior auricular or occipital adenopathy.     Left side of head: No submental, submandibular, tonsillar, preauricular, posterior auricular or occipital adenopathy.     Cervical: No cervical adenopathy.  Skin:    General: Skin is warm and dry.  Neurological:     Mental Status: He is alert.  Psychiatric:        Speech: Speech normal.        Behavior: Behavior normal.        Assessment & Plan:   Problem List Items Addressed This Visit      Other   Routine physical examination - Primary    Encouraged regular exercise. Screening labs ordered.       Relevant Orders   TSH (Completed)   CBC with Differential/Platelet (Completed)   Comprehensive metabolic panel (Completed)   Hemoglobin A1c (Completed)   Lipid panel (Completed)   VITAMIN D 25 Hydroxy (Vit-D Deficiency, Fractures) (Completed)   PSA (Completed)   SOB (shortness of breath) on exertion    Differentials include: cardiomyopathy ( cardiomegaly seen on CXR), COVID sequelae, deconditioning, obesity, asthma/allergies. Given albuterol inhaler to premedicate prior to exercise. Pending echo and consult with cardiology for further evaluation.      Relevant Medications   albuterol (VENTOLIN HFA) 108 (90 Base) MCG/ACT inhaler   Other Relevant Orders   DG Chest 2 View (Completed)    Other Visit Diagnoses    Cardiomegaly       Relevant Orders   Ambulatory referral to Cardiology   ECHOCARDIOGRAM COMPLETE       I am having Gary Pace "Gary Pace" start on albuterol. I am also having him maintain his pantoprazole and sildenafil.   Meds ordered this encounter  Medications  . albuterol (VENTOLIN HFA) 108 (90 Base) MCG/ACT inhaler    Sig: Inhale 2 puffs into the lungs every 6 (six) hours as needed for wheezing or shortness of breath.    Dispense:  6.7 g     Refill:  2    Order Specific Question:   Supervising Provider    Answer:   Crecencio Mc [[id number]]    Return precautions given.   Risks, benefits, and alternatives of the medications and treatment plan prescribed today were discussed, and patient expressed understanding.   Education regarding symptom management and diagnosis given to patient on AVS.   Continue to follow with Burnard Hawthorne, FNP for routine health maintenance.   [name] and I agreed with plan.  [name], FNP

## 2019-05-31 NOTE — Patient Instructions (Addendum)
Trial of albuterol inhaler prior to exercise, or yard work.  Stay on claritin.  Wearing mask when you mow the grass, and afterward using nasal saline spray.   Let me know how you are doing   Health Maintenance, Male Adopting a healthy lifestyle and getting preventive care are important in promoting health and wellness. Ask your health care provider about:  The right schedule for you to have regular tests and exams.  Things you can do on your own to prevent diseases and keep yourself healthy. What should I know about diet, weight, and exercise? Eat a healthy diet   Eat a diet that includes plenty of vegetables, fruits, low-fat dairy products, and lean protein.  Do not eat a lot of foods that are high in solid fats, added sugars, or sodium. Maintain a healthy weight Body mass index (BMI) is a measurement that can be used to identify possible weight problems. It estimates body fat based on height and weight. Your health care provider can help determine your BMI and help you achieve or maintain a healthy weight. Get regular exercise Get regular exercise. This is one of the most important things you can do for your health. Most adults should:  Exercise for at least 150 minutes each week. The exercise should increase your heart rate and make you sweat (moderate-intensity exercise).  Do strengthening exercises at least twice a week. This is in addition to the moderate-intensity exercise.  Spend less time sitting. Even light physical activity can be beneficial. Watch cholesterol and blood lipids Have your blood tested for lipids and cholesterol at 44 years of age, then have this test every 5 years. You may need to have your cholesterol levels checked more often if:  Your lipid or cholesterol levels are high.  You are older than 44 years of age.  You are at high risk for heart disease. What should I know about cancer screening? Many types of cancers can be detected early and may often  be prevented. Depending on your health history and family history, you may need to have cancer screening at various ages. This may include screening for:  Colorectal cancer.  Prostate cancer.  Skin cancer.  Lung cancer. What should I know about heart disease, diabetes, and high blood pressure? Blood pressure and heart disease  High blood pressure causes heart disease and increases the risk of stroke. This is more likely to develop in people who have high blood pressure readings, are of African descent, or are overweight.  Talk with your health care provider about your target blood pressure readings.  Have your blood pressure checked: ? Every 3-5 years if you are 60-75 years of age. ? Every year if you are 39 years old or older.  If you are between the ages of 5 and 41 and are a current or former smoker, ask your health care provider if you should have a one-time screening for abdominal aortic aneurysm (AAA). Diabetes Have regular diabetes screenings. This checks your fasting blood sugar level. Have the screening done:  Once every three years after age 73 if you are at a normal weight and have a low risk for diabetes.  More often and at a younger age if you are overweight or have a high risk for diabetes. What should I know about preventing infection? Hepatitis B If you have a higher risk for hepatitis B, you should be screened for this virus. Talk with your health care provider to find out if you are at  risk for hepatitis B infection. Hepatitis C Blood testing is recommended for:  Everyone born from [birth year] through 1965.  Anyone with known risk factors for hepatitis C. Sexually transmitted infections (STIs)  You should be screened each year for STIs, including gonorrhea and chlamydia, if: ? You are sexually active and are younger than 44 years of age. ? You are older than 44 years of age and your health care provider tells you that you are at risk for this type of  infection. ? Your sexual activity has changed since you were last screened, and you are at increased risk for chlamydia or gonorrhea. Ask your health care provider if you are at risk.  Ask your health care provider about whether you are at high risk for HIV. Your health care provider may recommend a prescription medicine to help prevent HIV infection. If you choose to take medicine to prevent HIV, you should first get tested for HIV. You should then be tested every 3 months for as long as you are taking the medicine. Follow these instructions at home: Lifestyle  Do not use any products that contain nicotine or tobacco, such as cigarettes, e-cigarettes, and chewing tobacco. If you need help quitting, ask your health care provider.  Do not use street drugs.  Do not share needles.  Ask your health care provider for help if you need support or information about quitting drugs. Alcohol use  Do not drink alcohol if your health care provider tells you not to drink.  If you drink alcohol: ? Limit how much you have to 0-2 drinks a day. ? Be aware of how much alcohol is in your drink. In the [place] one drink equals one 12 oz bottle of beer (355 mL), one 5 oz glass of wine (148 mL), or one 1 oz glass of hard liquor (44 mL). General instructions  Schedule regular health, dental, and eye exams.  Stay current with your vaccines.  Tell your health care provider if: ? You often feel depressed. ? You have ever been abused or do not feel safe at home. Summary  Adopting a healthy lifestyle and getting preventive care are important in promoting health and wellness.  Follow your health care provider's instructions about healthy diet, exercising, and getting tested or screened for diseases.  Follow your health care provider's instructions on monitoring your cholesterol and blood pressure. This information is not intended to replace advice given to you by your health care provider. Make sure you  discuss any questions you have with your health care provider. Document Revised: 01/28/2018 Document Reviewed: 01/28/2018 Elsevier Patient Education  2020 [employer].

## 2019-06-01 ENCOUNTER — Other Ambulatory Visit: Payer: Self-pay | Admitting: Family

## 2019-06-01 DIAGNOSIS — R899 Unspecified abnormal finding in specimens from other organs, systems and tissues: Secondary | ICD-10-CM

## 2019-06-01 LAB — HEMOGLOBIN A1C: Hgb A1c MFr Bld: 5.8 % (ref 4.6–6.5)

## 2019-06-01 LAB — PSA: PSA: 0.2 ng/mL (ref 0.10–4.00)

## 2019-06-01 LAB — VITAMIN D 25 HYDROXY (VIT D DEFICIENCY, FRACTURES): VITD: 23.57 ng/mL — ABNORMAL LOW (ref 30.00–100.00)

## 2019-06-01 LAB — TSH: TSH: 3.52 u[IU]/mL (ref 0.35–4.50)

## 2019-06-01 NOTE — Assessment & Plan Note (Signed)
Encouraged regular exercise. Screening labs ordered.

## 2019-06-01 NOTE — Assessment & Plan Note (Addendum)
Differentials include: cardiomyopathy ( cardiomegaly seen on CXR), COVID sequelae, deconditioning, obesity, asthma/allergies. Given albuterol inhaler to premedicate prior to exercise. Pending echo and consult with cardiology for further evaluation.

## 2019-06-03 ENCOUNTER — Ambulatory Visit: Payer: BC Managed Care – PPO | Admitting: Cardiology

## 2019-06-03 ENCOUNTER — Encounter: Payer: Self-pay | Admitting: Cardiology

## 2019-06-03 ENCOUNTER — Other Ambulatory Visit: Payer: Self-pay

## 2019-06-03 VITALS — BP 120/92 | HR 45 | Ht 66.0 in | Wt 229.0 lb

## 2019-06-03 DIAGNOSIS — R0602 Shortness of breath: Secondary | ICD-10-CM | POA: Diagnosis not present

## 2019-06-03 DIAGNOSIS — I517 Cardiomegaly: Secondary | ICD-10-CM

## 2019-06-03 NOTE — Progress Notes (Signed)
Cardiology Office Note:    Date:  06/03/2019   ID:  Gary Pace, DOB 1975-03-25, MRN [id number]  PCP:  Allegra Grana, FNP  Cardiologist:  Debbe Odea, MD  Electrophysiologist:  None   Referring MD: Allegra Grana, FNP   Chief Complaint  Patient presents with  . OTHER    Cardiomegaly no complaints today. Meds reviewed verbally with pt.   Gary Pace is a 44 y.o. male who is being seen today for the evaluation of cardiomegaly at the request of Jason Coop Lyn Records, FNP.   History of Present Illness:    Gary Pace is a 44 y.o. male with a hx of GERD, BPH, who presents due to cardiomegaly.  Patient was diagnosed with COVID-19 on 03/08/2019 after having symptoms of fevers.  In the COVID-19 diagnosis, has been having shortness of breath sometimes with exertion and sometimes at rest.  He denies any symptoms of shortness of breath prior to his COVID-19 diagnosis.  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  He tried using albuterol as needed with some improvement.  Symptoms have stayed the same .  Chest x-ray was ordered by primary care provider which showed borderline cardiomegaly, no active cardiopulmonary disease.  He denies any chest pain, edema, history of heart disease, smoking, palpitations.  Past Medical History:  Diagnosis Date  . Enlarged prostate    Followed by Dr. Achilles Dunk    Past Surgical History:  Procedure Laterality Date  . CARPAL TUNNEL RELEASE Right    [facility]  . TONSILLECTOMY AND ADENOIDECTOMY  2008    Current Medications: Current Meds  Medication Sig  . albuterol (VENTOLIN HFA) 108 (90 Base) MCG/ACT inhaler Inhale 2 puffs into the lungs every 6 (six) hours as needed for wheezing or shortness of breath.  . pantoprazole (PROTONIX) 20 MG tablet Take 20 mg by mouth daily.  . sildenafil (REVATIO) 20 MG tablet Take 1 tablet (20 mg total) by mouth daily as needed. Take 1-5 tabs about one hour before sexual  activity  . [DISCONTINUED] fluticasone (FLONASE) 50 MCG/ACT nasal spray Place into both nostrils daily as needed for allergies or rhinitis.  . [DISCONTINUED] loratadine (CLARITIN) 10 MG tablet Take 10 mg by mouth daily as needed for allergies.     Allergies:   Patient has no known allergies.   Social History   Socioeconomic History  . Marital status: Single    Spouse name: Not on file  . Number of children: Not on file  . Years of education: Not on file  . Highest education level: Not on file  Occupational History  . Not on file  Tobacco Use  . Smoking status: Never Smoker  . Smokeless tobacco: Never Used  Substance and Sexual Activity  . Alcohol use: No    Alcohol/week: 0.0 standard drinks  . Drug use: No  . Sexual activity: Not on file  Other Topics Concern  . Not on file  Social History Narrative   Lives in [place]. 2 dogs.      Work - [employer], Tour manager      Diet - regular      Exercise - none   Social Determinants of Corporate investment banker Strain:   . Difficulty of Paying Living Expenses:   Food Insecurity:   . Worried About Programme researcher, broadcasting/film/video in the Last Year:   . Barista in the Last Year:   Transportation Needs:   .  Lack of Transportation (Medical):   Marland Kitchen Lack of Transportation (Non-Medical):   Physical Activity:   . Days of Exercise per Week:   . Minutes of Exercise per Session:   Stress:   . Feeling of Stress :   Social Connections:   . Frequency of Communication with Friends and Family:   . Frequency of Social Gatherings with Friends and Family:   . Attends Religious Services:   . Active Member of Clubs or Organizations:   . Attends Banker Meetings:   Marland Kitchen Marital Status:      Family History: The patient's family history includes AAA (abdominal aortic aneurysm) in his paternal aunt; Cancer in his paternal aunt; Cancer (age of onset: 22) in his paternal grandfather; Diabetes in his paternal grandfather; Heart attack in his  paternal grandfather; Other in his father, mother, and paternal grandfather; Stroke in his paternal grandfather; Thyroid disease in his brother.  ROS:   Please see the history of present illness.     All other systems reviewed and are negative.  EKGs/Labs/Other Studies Reviewed:    The following studies were reviewed today:   EKG:  EKG is  ordered today.  The ekg ordered today demonstrates sinus bradycardia, otherwise normal ECG  Recent Labs: 05/31/2019: ALT 26; BUN 11; Creatinine, Ser 0.91; Hemoglobin 14.2; Platelets 255.0; Potassium 3.9; Sodium 140; TSH 3.52  Recent Lipid Panel    Component Value Date/Time   CHOL 175 05/31/2019 1448   TRIG 67.0 05/31/2019 1448   HDL 39.60 05/31/2019 1448   CHOLHDL 4 05/31/2019 1448   VLDL 13.4 05/31/2019 1448   LDLCALC 122 (H) 05/31/2019 1448    Physical Exam:    VS:  BP (!) 120/92 (BP Location: Right Arm, Patient Position: Sitting, Cuff Size: Large)   Pulse (!) 45   Ht 5\' 6"  (1.676 m)   Wt 229 lb (103.9 kg)   SpO2 98%   BMI 36.96 kg/m     Wt Readings from Last 3 Encounters:  06/03/19 229 lb (103.9 kg)  05/31/19 232 lb 3.2 oz (105.3 kg)  05/07/19 223 lb (101.2 kg)     GEN:  Well nourished, well developed in no acute distress HEENT: Normal NECK: No JVD; No carotid bruits LYMPHATICS: No lymphadenopathy CARDIAC: RRR, no murmurs, rubs, gallops RESPIRATORY:  Clear to auscultation without rales, wheezing or rhonchi  ABDOMEN: Soft, non-tender, non-distended MUSCULOSKELETAL:  No edema; No deformity  SKIN: Warm and dry NEUROLOGIC:  Alert and oriented x 3 PSYCHIATRIC:  Normal affect   ASSESSMENT:    1. SOB (shortness of breath) on exertion   2. Cardiomegaly    PLAN:    In order of problems listed above:  1. Patient with 52-month history of shortness of breath after COVID-19 infection.  Symptoms improved somewhat after as needed albuterol use.  This could be secondary to a post viral syndrome as he denied any symptoms with or  without exertion prior to being diagnosed with COVID-19.  He has no risk factors of cardiac disease, as such I do not think this is an anginal equivalent.  If symptoms persist, we may consider further testing such as stress test. 2. Cardiomegaly noted on recent chest x-ray.  We will get an echocardiogram to evaluate cardiac size and function.  Follow up after echocardiogram.  This note was generated in part or whole with voice recognition software. Voice recognition is usually quite accurate but there are transcription errors that can and very often do occur. I apologize for any  typographical errors that were not detected and corrected.  Medication Adjustments/Labs and Tests Ordered: Current medicines are reviewed at length with the patient today.  Concerns regarding medicines are outlined above.  Orders Placed This Encounter  Procedures  . EKG 12-Lead  . ECHOCARDIOGRAM COMPLETE   No orders of the defined types were placed in this encounter.   Patient Instructions  Medication Instructions:  No changes  *If you need a refill on your cardiac medications before your next appointment, please call your pharmacy*  Lab Work: No changes  If you have labs (blood work) drawn today and your tests are completely normal, you will receive your results only by: Marland Kitchen MyChart Message (if you have MyChart) OR . A paper copy in the mail If you have any lab test that is abnormal or we need to change your treatment, we will call you to review the results.  Testing/Procedures: Your physician has requested that you have an echocardiogram. Echocardiography is a painless test that uses sound waves to create images of your heart. It provides your doctor with information about the size and shape of your heart and how well your heart's chambers and valves are working. This procedure takes approximately one hour. There are no restrictions for this procedure.  Follow-Up: At [facility], you and your health  needs are our priority.  As part of our continuing mission to provide you with exceptional heart care, we have created designated Provider Care Teams.  These Care Teams include your primary Cardiologist (physician) and Advanced Practice Providers (APPs -  Physician Assistants and Nurse Practitioners) who all work together to provide you with the care you need, when you need it.  Your next appointment:   Follow up after echocardiogram has been done.   The format for your next appointment:   In Person  Provider:    You may see Kate Sable, MD or one of the following Advanced Practice Providers on your designated Care Team:    Murray Hodgkins, NP  Christell Faith, PA-C  Marrianne Mood, PA-C       Signed, Kate Sable, MD  06/03/2019 10:53 AM    [place]

## 2019-06-03 NOTE — Patient Instructions (Signed)
Medication Instructions:  No changes  *If you need a refill on your cardiac medications before your next appointment, please call your pharmacy*  Lab Work: No changes  If you have labs (blood work) drawn today and your tests are completely normal, you will receive your results only by: Marland Kitchen MyChart Message (if you have MyChart) OR . A paper copy in the mail If you have any lab test that is abnormal or we need to change your treatment, we will call you to review the results.  Testing/Procedures: Your physician has requested that you have an echocardiogram. Echocardiography is a painless test that uses sound waves to create images of your heart. It provides your doctor with information about the size and shape of your heart and how well your heart's chambers and valves are working. This procedure takes approximately one hour. There are no restrictions for this procedure.  Follow-Up: At [facility], you and your health needs are our priority.  As part of our continuing mission to provide you with exceptional heart care, we have created designated Provider Care Teams.  These Care Teams include your primary Cardiologist (physician) and Advanced Practice Providers (APPs -  Physician Assistants and Nurse Practitioners) who all work together to provide you with the care you need, when you need it.  Your next appointment:   Follow up after echocardiogram has been done.   The format for your next appointment:   In Person  Provider:    You may see Debbe Odea, MD or one of the following Advanced Practice Providers on your designated Care Team:    Nicolasa Ducking, NP  Eula Listen, PA-C  Marisue Ivan, PA-C

## 2019-07-08 ENCOUNTER — Other Ambulatory Visit: Payer: Self-pay

## 2019-07-08 ENCOUNTER — Ambulatory Visit (INDEPENDENT_AMBULATORY_CARE_PROVIDER_SITE_OTHER): Payer: BC Managed Care – PPO

## 2019-07-08 DIAGNOSIS — I517 Cardiomegaly: Secondary | ICD-10-CM | POA: Diagnosis not present

## 2019-07-09 ENCOUNTER — Telehealth: Payer: Self-pay

## 2019-07-09 NOTE — Telephone Encounter (Signed)
Call to patient to review echo results.    Pt verbalized understanding and has no further questions at this time.    Advised pt to call for any further questions or concerns.  No further orders.  Confirmed follow up appt.

## 2019-07-09 NOTE — Telephone Encounter (Signed)
-----   Message from Debbe Odea, MD sent at 07/08/2019  6:17 PM EDT ----- Normal systolic function, impaired relaxation.  Otherwise okay echocardiogram.  Keep follow-up appointment.

## 2019-07-12 ENCOUNTER — Other Ambulatory Visit (INDEPENDENT_AMBULATORY_CARE_PROVIDER_SITE_OTHER): Payer: BC Managed Care – PPO

## 2019-07-12 ENCOUNTER — Other Ambulatory Visit: Payer: Self-pay

## 2019-07-12 DIAGNOSIS — R899 Unspecified abnormal finding in specimens from other organs, systems and tissues: Secondary | ICD-10-CM | POA: Diagnosis not present

## 2019-07-12 LAB — CBC WITH DIFFERENTIAL/PLATELET
Basophils Absolute: 0 10*3/uL (ref 0.0–0.1)
Basophils Relative: 0.5 % (ref 0.0–3.0)
Eosinophils Absolute: 0.4 10*3/uL (ref 0.0–0.7)
Eosinophils Relative: 4.6 % (ref 0.0–5.0)
HCT: 42 % (ref 39.0–52.0)
Hemoglobin: 14.4 g/dL (ref 13.0–17.0)
Lymphocytes Relative: 35.7 % (ref 12.0–46.0)
Lymphs Abs: 2.8 10*3/uL (ref 0.7–4.0)
MCHC: 34.3 g/dL (ref 30.0–36.0)
MCV: 86.1 fl (ref 78.0–100.0)
Monocytes Absolute: 0.5 10*3/uL (ref 0.1–1.0)
Monocytes Relative: 6.7 % (ref 3.0–12.0)
Neutro Abs: 4.1 10*3/uL (ref 1.4–7.7)
Neutrophils Relative %: 52.5 % (ref 43.0–77.0)
Platelets: 265 10*3/uL (ref 150.0–400.0)
RBC: 4.88 Mil/uL (ref 4.22–5.81)
RDW: 13 % (ref 11.5–15.5)
WBC: 7.8 10*3/uL (ref 4.0–10.5)

## 2019-07-16 ENCOUNTER — Other Ambulatory Visit: Payer: Self-pay

## 2019-07-16 ENCOUNTER — Encounter: Payer: Self-pay | Admitting: Cardiology

## 2019-07-16 ENCOUNTER — Ambulatory Visit: Payer: BC Managed Care – PPO | Admitting: Cardiology

## 2019-07-16 VITALS — BP 126/80 | HR 38 | Ht 66.0 in | Wt 227.5 lb

## 2019-07-16 DIAGNOSIS — I517 Cardiomegaly: Secondary | ICD-10-CM | POA: Diagnosis not present

## 2019-07-16 DIAGNOSIS — R0602 Shortness of breath: Secondary | ICD-10-CM

## 2019-07-16 NOTE — Progress Notes (Signed)
Cardiology Office Note:    Date:  07/16/2019   ID:  Gary Pace, DOB [birth year], MRN [id number]  PCP:  Allegra Grana, FNP  Cardiologist:  Debbe Odea, MD  Electrophysiologist:  None   Referring MD: Allegra Grana, FNP   Chief Complaint  Patient presents with  . OTHER    F/u echo no complaints today. Meds reviewed verbally with pt.    History of Present Illness:    Gary Pace is a 44 y.o. male with a hx of GERD, BPH, who presents for follow-up.  He was last seen due to cardiomegaly noted on a chest x-ray after a prior COVID-19 diagnosis.  He had nonspecific shortness of breath at the time.  He denied symptoms of chest pain, edema, heart disease, palpitations.  Echocardiogram was ordered to evaluate cardiac function.  Patient has no complaints today.  His symptoms of shortness of breath are gradually improving.   Past Medical History:  Diagnosis Date  . Enlarged prostate    Followed by Dr. Achilles Dunk    Past Surgical History:  Procedure Laterality Date  . CARPAL TUNNEL RELEASE Right    [facility]  . TONSILLECTOMY AND ADENOIDECTOMY  2008    Current Medications: Current Meds  Medication Sig  . albuterol (VENTOLIN HFA) 108 (90 Base) MCG/ACT inhaler Inhale 2 puffs into the lungs every 6 (six) hours as needed for wheezing or shortness of breath.  . pantoprazole (PROTONIX) 20 MG tablet Take 20 mg by mouth daily.  . sildenafil (REVATIO) 20 MG tablet Take 1 tablet (20 mg total) by mouth daily as needed. Take 1-5 tabs about one hour before sexual activity  . [DISCONTINUED] fluticasone (FLONASE) 50 MCG/ACT nasal spray Place into both nostrils daily as needed for allergies or rhinitis.  . [DISCONTINUED] loratadine (CLARITIN) 10 MG tablet Take 10 mg by mouth daily as needed for allergies.     Allergies:   Patient has no known allergies.   Social History   Socioeconomic History  . Marital status: Single    Spouse name: Not on file  . Number of children: Not on file    . Years of education: Not on file  . Highest education level: Not on file  Occupational History  . Not on file  Tobacco Use  . Smoking status: Never Smoker  . Smokeless tobacco: Never Used  Substance and Sexual Activity  . Alcohol use: No    Alcohol/week: 0.0 standard drinks  . Drug use: No  . Sexual activity: Not on file  Other Topics Concern  . Not on file  Social History Narrative   Lives in [place]. 2 dogs.      Work - [employer], Tour manager      Diet - regular      Exercise - none   Social Determinants of Corporate investment banker Strain:   . Difficulty of Paying Living Expenses:   Food Insecurity:   . Worried About Programme researcher, broadcasting/film/video in the Last Year:   . Barista in the Last Year:   Transportation Needs:   . Freight forwarder (Medical):   Marland Kitchen Lack of Transportation (Non-Medical):   Physical Activity:   . Days of Exercise per Week:   . Minutes of Exercise per Session:   Stress:   . Feeling of Stress :   Social Connections:   . Frequency of Communication with Friends and Family:   . Frequency of Social Gatherings with Friends and Family:   .  Attends Religious Services:   . Active Member of Clubs or Organizations:   . Attends Archivist Meetings:   Marland Kitchen Marital Status:      Family History: The patient's family history includes AAA (abdominal aortic aneurysm) in his paternal aunt; Cancer in his paternal aunt; Cancer (age of onset: 61) in his paternal grandfather; Diabetes in his paternal grandfather; Heart attack in his paternal grandfather; Other in his father, mother, and paternal grandfather; Stroke in his paternal grandfather; Thyroid disease in his brother.  ROS:   Please see the history of present illness.     All other systems reviewed and are negative.  EKGs/Labs/Other Studies Reviewed:    The following studies were reviewed today:   EKG:  EKG is  ordered today.  The ekg ordered today demonstrates sinus bradycardia  Recent  Labs: 05/31/2019: ALT 26; BUN 11; Creatinine, Ser 0.91; Potassium 3.9; Sodium 140; TSH 3.52 07/12/2019: Hemoglobin 14.4; Platelets 265.0  Recent Lipid Panel    Component Value Date/Time   CHOL 175 05/31/2019 1448   TRIG 67.0 05/31/2019 1448   HDL 39.60 05/31/2019 1448   CHOLHDL 4 05/31/2019 1448   VLDL 13.4 05/31/2019 1448   LDLCALC 122 (H) 05/31/2019 1448    Physical Exam:    VS:  BP 126/80 (BP Location: Left Arm, Patient Position: Sitting, Cuff Size: Large)   Pulse (!) 38   Ht 5\' 6"  (1.676 m)   Wt 227 lb 8 oz (103.2 kg)   SpO2 98%   BMI 36.72 kg/m     Wt Readings from Last 3 Encounters:  07/16/19 227 lb 8 oz (103.2 kg)  06/03/19 229 lb (103.9 kg)  05/31/19 232 lb 3.2 oz (105.3 kg)     GEN:  Well nourished, well developed in no acute distress HEENT: Normal NECK: No JVD; No carotid bruits LYMPHATICS: No lymphadenopathy CARDIAC: RRR, no murmurs, rubs, gallops RESPIRATORY:  Clear to auscultation without rales, wheezing or rhonchi  ABDOMEN: Soft, non-tender, non-distended MUSCULOSKELETAL:  No edema; No deformity  SKIN: Warm and dry NEUROLOGIC:  Alert and oriented x 3 PSYCHIATRIC:  Normal affect   ASSESSMENT:    1. SOB (shortness of breath) on exertion   2. Cardiomegaly    PLAN:    In order of problems listed above:  1. Patient with 2-month history of shortness of breath after COVID-19 infection.  Symptoms of dyspnea are gradually improving.  Patient likely had a post viral syndrome from COVID-19.  No indication for additional testing at this time.   2. Cardiomegaly noted on recent chest x-ray.  Echocardiogram showed normal LV size, normal LV function, impaired relaxation.  Otherwise no gross structural abnormalities.  Follow up as needed.  This note was generated in part or whole with voice recognition software. Voice recognition is usually quite accurate but there are transcription errors that can and very often do occur. I apologize for any typographical errors  that were not detected and corrected.  Medication Adjustments/Labs and Tests Ordered: Current medicines are reviewed at length with the patient today.  Concerns regarding medicines are outlined above.  Orders Placed This Encounter  Procedures  . EKG 12-Lead   No orders of the defined types were placed in this encounter.   Patient Instructions  Medication Instructions:  No changes  *If you need a refill on your cardiac medications before your next appointment, please call your pharmacy*   Lab Work: None  If you have labs (blood work) drawn today and your tests are completely  normal, you will receive your results only by: Marland Kitchen MyChart Message (if you have MyChart) OR . A paper copy in the mail If you have any lab test that is abnormal or we need to change your treatment, we will call you to review the results.   Testing/Procedures: None   Follow-Up: At [facility], you and your health needs are our priority.  As part of our continuing mission to provide you with exceptional heart care, we have created designated Provider Care Teams.  These Care Teams include your primary Cardiologist (physician) and Advanced Practice Providers (APPs -  Physician Assistants and Nurse Practitioners) who all work together to provide you with the care you need, when you need it.  We recommend signing up for the patient portal called "MyChart".  Sign up information is provided on this After Visit Summary.  MyChart is used to connect with patients for Virtual Visits (Telemedicine).  Patients are able to view lab/test results, encounter notes, upcoming appointments, etc.  Non-urgent messages can be sent to your provider as well.   To learn more about what you can do with MyChart, go to [email or web address].    Your next appointment:   Follow up as needed.    Signed, Debbe Odea, MD  07/16/2019 5:14 PM    [facility]

## 2019-07-16 NOTE — Patient Instructions (Signed)

## 2020-02-09 ENCOUNTER — Telehealth: Payer: Self-pay | Admitting: Family

## 2020-02-09 MED ORDER — PANTOPRAZOLE SODIUM 20 MG PO TBEC: 20.0000 mg | DELAYED_RELEASE_TABLET | Freq: Every day | ORAL | 1 refills | Status: DC

## 2020-02-09 NOTE — Telephone Encounter (Signed)
Patient needs a refill on his pantoprazole (PROTONIX) 20 MG tablet.

## 2020-05-05 ENCOUNTER — Ambulatory Visit: Payer: Self-pay | Admitting: Urology

## 2020-05-05 ENCOUNTER — Encounter: Payer: Self-pay | Admitting: Physician Assistant

## 2020-05-05 ENCOUNTER — Ambulatory Visit: Payer: BC Managed Care – PPO | Admitting: Physician Assistant

## 2020-05-05 ENCOUNTER — Other Ambulatory Visit: Payer: Self-pay

## 2020-05-05 VITALS — BP 145/85 | HR 53 | Wt 239.0 lb

## 2020-05-05 DIAGNOSIS — N5201 Erectile dysfunction due to arterial insufficiency: Secondary | ICD-10-CM

## 2020-05-05 DIAGNOSIS — G8929 Other chronic pain: Secondary | ICD-10-CM

## 2020-05-05 DIAGNOSIS — N401 Enlarged prostate with lower urinary tract symptoms: Secondary | ICD-10-CM | POA: Diagnosis not present

## 2020-05-05 DIAGNOSIS — R351 Nocturia: Secondary | ICD-10-CM | POA: Diagnosis not present

## 2020-05-05 DIAGNOSIS — R102 Pelvic and perineal pain: Secondary | ICD-10-CM

## 2020-05-05 LAB — BLADDER SCAN AMB NON-IMAGING: Scan Result: 0 mL

## 2020-05-05 NOTE — Progress Notes (Signed)
05/05/2020 1:34 PM   Gary Pace 08/21/1975 [id number]  CC: Chief Complaint  Patient presents with  . Follow-up    HPI: Gary Pace is a 45 y.o. male with PMH chronic pelvic and testicular pain, BPH with urgency and nocturia on alfuzosin, and ED on occasional sildenafil who presents today for annual follow-up.  Today patient reports resolution of his chronic pelvic and testicular pain.  As for his urinary symptoms, he denies urinary urgency and reports ongoing but improved double voiding overnight.  He attributes this to increased water intake before bedtime.  He has been taking alfuzosin as needed only and has no significant urinary complaints today.  Lastly, he reports he has not taken sildenafil in quite some time.  He attributes his history of ED to having the same sexual partner for 15+ years. PVR 64mL.  Notably, patient has a family history of prostate cancer in his paternal grandfather, diagnosed at age 51.  His PSA has been stably WNL.  PMH: Past Medical History:  Diagnosis Date  . Enlarged prostate    Followed by Dr. Achilles Dunk    Surgical History: Past Surgical History:  Procedure Laterality Date  . CARPAL TUNNEL RELEASE Right    [facility]  . TONSILLECTOMY AND ADENOIDECTOMY  2008    Home Medications:  Allergies as of 05/05/2020   No Known Allergies     Medication List       Accurate as of May 05, 2020  1:34 PM. If you have any questions, ask your nurse or doctor.        albuterol 108 (90 Base) MCG/ACT inhaler Commonly known as: VENTOLIN HFA Inhale 2 puffs into the lungs every 6 (six) hours as needed for wheezing or shortness of breath.   pantoprazole 20 MG tablet Commonly known as: PROTONIX Take 1 tablet (20 mg total) by mouth daily.   sildenafil 20 MG tablet Commonly known as: REVATIO Take 1 tablet (20 mg total) by mouth daily as needed. Take 1-5 tabs about one hour before sexual activity       Allergies:  No Known Allergies  Family  History: Family History  Problem Relation Age of Onset  . Thyroid disease Brother   . Cancer Paternal Grandfather 82       prostate cancer  . Stroke Paternal Grandfather   . Diabetes Paternal Grandfather   . Other Paternal Grandfather   . Heart attack Paternal Grandfather   . Cancer Paternal Aunt        breast, brain  . Other Mother        unknown medical history  . Other Father        unknown medical history  . AAA (abdominal aortic aneurysm) Paternal Aunt     Social History:   reports that he has never smoked. He has never used smokeless tobacco. He reports that he does not drink alcohol and does not use drugs.  Physical Exam: BP (!) 145/85   Pulse (!) 53   Wt 239 lb (108.4 kg)   BMI 38.58 kg/m   Constitutional:  Alert and oriented, no acute distress, nontoxic appearing HEENT: [place], AT Cardiovascular: No clubbing, cyanosis, or edema Respiratory: Normal respiratory effort, no increased work of breathing GU: Normal sphincter tone, smooth and symmetrically enlarged 40+ cc prostate without nodules or induration Skin: No rashes, bruises or suspicious lesions Neurologic: Grossly intact, no focal deficits, moving all 4 extremities Psychiatric: Normal mood and affect  Laboratory Data: Results for orders placed or performed  in visit on 05/05/20  BLADDER SCAN AMB NON-IMAGING  Result Value Ref Range   Scan Result 0 mL   Assessment & Plan:   1. Benign prostatic hyperplasia with nocturia Minimally symptomatic and taking alfuzosin as needed only.  He declines refills today.  PVR WNL.  I did obtain a PSA prior to DRE today given his family history of prostate cancer.  Recommended PSA screening every 1 to 2 years moving forward given his family history.  He expressed understanding. - BLADDER SCAN AMB NON-IMAGING - PSA  2. Chronic pelvic pain in male Minimal to resolved, no further intervention indicated.  3. Erectile dysfunction due to arterial insufficiency Minimal to  resolved, patient declines refills today.  Will refill as needed moving forward.  Return in about 1 year (around 05/05/2021) for Annual DRE/IPSS/PVR with PSA prior.  Carman Ching, PA-C  [facility] [address] [place], [place] [address] [phone]

## 2020-05-06 LAB — PSA: Prostate Specific Ag, Serum: 0.2 ng/mL (ref 0.0–4.0)

## 2020-05-08 ENCOUNTER — Telehealth: Payer: Self-pay

## 2020-05-08 NOTE — Telephone Encounter (Signed)
-----   Message from Carman Ching, [place] sent at 05/08/2020 11:36 AM EDT ----- PSA stable, great news. Keep annual follow-up. ----- Message ----- From: Interface, [facility] Lab Results In Sent: 05/06/2020   5:37 AM EDT To: Carman Ching, PA-C

## 2020-05-08 NOTE — Telephone Encounter (Signed)
Notified patient as advised, patient verbalized understanding.  

## 2020-06-01 ENCOUNTER — Ambulatory Visit: Payer: BC Managed Care – PPO | Admitting: Family

## 2020-06-01 ENCOUNTER — Other Ambulatory Visit: Payer: Self-pay

## 2020-06-01 ENCOUNTER — Encounter: Payer: Self-pay | Admitting: Family

## 2020-06-01 VITALS — BP 108/68 | HR 50 | Temp 98.0°F | Ht 66.0 in | Wt 233.8 lb

## 2020-06-01 DIAGNOSIS — Z0000 Encounter for general adult medical examination without abnormal findings: Secondary | ICD-10-CM | POA: Diagnosis not present

## 2020-06-01 DIAGNOSIS — E669 Obesity, unspecified: Secondary | ICD-10-CM | POA: Diagnosis not present

## 2020-06-01 LAB — TSH: TSH: 2.11 u[IU]/mL (ref 0.35–4.50)

## 2020-06-01 MED ORDER — WEGOVY 0.25 MG/0.5ML ~~LOC~~ SOAJ: 0.2500 mg | SUBCUTANEOUS | 2 refills | Status: DC

## 2020-06-01 NOTE — Progress Notes (Signed)
Subjective:    Patient ID: Gary Pace, male    DOB: 09/01/1975, 45 y.o.   MRN: [id number]  CC: Gary Pace is a 45 y.o. male who presents today for physical exam.    HPI: Feels well today. He is concerned regarding recent blood work with employer which reflects elevated LDL cholesterol and prediabetes. He would like to focus on weight loss.  He is interested in weight loss medication.   No personal or family h/o thyroid cancer   Bradycardia- consulted with dr Gary Pace 06/2019. HR 38 during visit. Cardiomegaly. No additional follow up. He denies CP, sob, dizziness, syncope.   Colorectal  Cancer Screening: due Prostate Cancer Screening: Done 3 weeks ago with urology.   Lung Cancer Screening: No 30 year pack year history and > 50 years to 80 years.  Immunizations       Tetanus - UTD       Hepatitis C screening - Candidate for, declines at this time  Labs: Screening labs done prior.  Exercise: Gets regular exercise with yardwork.   Alcohol use: never Smoking/tobacco use: Nonsmoker.     HISTORY:  Past Medical History:  Diagnosis Date  . Enlarged prostate    Followed by Gary Pace    Past Surgical History:  Procedure Laterality Date  . CARPAL TUNNEL RELEASE Right    [facility]  . TONSILLECTOMY AND ADENOIDECTOMY  2008   Family History  Problem Relation Age of Onset  . Thyroid disease Brother   . Cancer Paternal Grandfather 60       prostate cancer  . Stroke Paternal Grandfather   . Diabetes Paternal Grandfather   . Other Paternal Grandfather   . Heart attack Paternal Grandfather   . Cancer Paternal Aunt        breast, brain  . Other Mother        unknown medical history  . Other Father        unknown medical history  . AAA (abdominal aortic aneurysm) Paternal Aunt   . Thyroid cancer Neg Hx       ALLERGIES: Patient has no known allergies.  Current Outpatient Medications on File Prior to Visit  Medication Sig Dispense Refill  . pantoprazole (PROTONIX) 20 MG  tablet Take 1 tablet (20 mg total) by mouth daily. 90 tablet 1  . [DISCONTINUED] fluticasone (FLONASE) 50 MCG/ACT nasal spray Place into both nostrils daily as needed for allergies or rhinitis.    . [DISCONTINUED] loratadine (CLARITIN) 10 MG tablet Take 10 mg by mouth daily as needed for allergies.    Marland Kitchen albuterol (VENTOLIN HFA) 108 (90 Base) MCG/ACT inhaler Inhale 2 puffs into the lungs every 6 (six) hours as needed for wheezing or shortness of breath. (Patient not taking: Reported on 06/01/2020) 6.7 g 2  . sildenafil (REVATIO) 20 MG tablet Take 1 tablet (20 mg total) by mouth daily as needed. Take 1-5 tabs about one hour before sexual activity (Patient not taking: Reported on 06/01/2020) 30 tablet 6   No current facility-administered medications on file prior to visit.    Social History   Tobacco Use  . Smoking status: Never Smoker  . Smokeless tobacco: Never Used  Vaping Use  . Vaping Use: Never used  Substance Use Topics  . Alcohol use: No    Alcohol/week: 0.0 standard drinks  . Drug use: No    Review of Systems  Constitutional: Negative for chills and fever.  HENT: Negative for congestion.   Respiratory: Negative for cough.  Cardiovascular: Negative for chest pain, palpitations and leg swelling.  Gastrointestinal: Negative for diarrhea, nausea and vomiting.  Musculoskeletal: Negative for myalgias.  Skin: Negative for rash.  Neurological: Negative for headaches.  Hematological: Negative for adenopathy.  Psychiatric/Behavioral: Negative for confusion.      Objective:    BP 108/68   Pulse (!) 50   Temp 98 F (36.7 C)   Ht 5\' 6"  (1.676 m)   Wt 233 lb 12.8 oz (106.1 kg)   SpO2 98%   BMI 37.74 kg/m   BP Readings from Last 3 Encounters:  06/01/20 108/68  05/05/20 (!) 145/85  07/16/19 126/80   Wt Readings from Last 3 Encounters:  06/01/20 233 lb 12.8 oz (106.1 kg)  05/05/20 239 lb (108.4 kg)  07/16/19 227 lb 8 oz (103.2 kg)    Physical Exam Vitals reviewed.   Constitutional:      Appearance: He is well-developed.  Neck:     Thyroid: No thyroid mass or thyromegaly.  Cardiovascular:     Rate and Rhythm: Regular rhythm.     Heart sounds: Normal heart sounds.  Pulmonary:     Effort: Pulmonary effort is normal. No respiratory distress.     Breath sounds: Normal breath sounds. No wheezing, rhonchi or rales.  Lymphadenopathy:     Head:     Right side of head: No submental, submandibular, tonsillar, preauricular, posterior auricular or occipital adenopathy.     Left side of head: No submental, submandibular, tonsillar, preauricular, posterior auricular or occipital adenopathy.     Cervical: No cervical adenopathy.  Skin:    General: Skin is warm and dry.  Neurological:     Mental Status: He is alert.  Psychiatric:        Speech: Speech normal.        Behavior: Behavior normal.        Assessment & Plan:   Problem List Items Addressed This Visit      Other   Obesity (BMI 30.0-34.9)    No contraindications to starting wegovy and counseled in regards to black box warning related to thyroid cancer. Educated on low glycemic diet and increase of exercise.       Relevant Medications   Semaglutide-Weight Management (WEGOVY) 0.25 MG/0.5ML SOAJ   Other Relevant Orders   TSH   Routine physical examination - Primary    Deferred prostate exam or PSA as he is following with urology. Colonoscopy referral in place. Reviewed labs with patient today as done by employer. Agreed that weight loss in setting of elevated LDL cholesterol and prediabetes. TSH included today.       Relevant Orders   Ambulatory referral to Gastroenterology   TSH       I am having Gary Pace "06-22-1992" start on [name]. I am also having him maintain his sildenafil, albuterol, and pantoprazole.   Meds ordered this encounter  Medications  . Semaglutide-Weight Management (WEGOVY) 0.25 MG/0.5ML SOAJ    Sig: Inject 0.25 mg into the skin once a week.    Dispense:  2 mL     Refill:  2    Order Specific Question:   Supervising Provider    Answer:   Gary Pace [[id number]]    Return precautions given.   Risks, benefits, and alternatives of the medications and treatment plan prescribed today were discussed, and patient expressed understanding.   Education regarding symptom management and diagnosis given to patient on AVS.   Continue to follow with Mykelti Goldenstein, Sherlene Shams, FNP  for routine health maintenance.   [name] and I agreed with plan.   Rennie Plowman, FNP

## 2020-06-01 NOTE — Patient Instructions (Signed)
We have discussed starting non insulin daily injectable medication called Wegovy  which is a glucagon like peptide (GLP 1) agonist and works by delaying gastric emptying and increasing insulin secretion.It is given once per week. Most patients see significant weight loss with this drug class.   You may NOT take either medication if you or your family has history of thyroid, parathyroid, OR adrenal cancer. Please confirm you and your family does NOT have this history as this drug class has black box warning on this medication for that reason.   Advise to follow with directions on prescription and slowly increase from 0.25mg  Tribbey once per week ;stay here for 4 weeks. You may then increase to 0.5mg  Livengood once per week and stay there for 4 weeks.  We can slowly titrate further at follow up with goal of no more than 1-2 lbs weight loss per week.  This is  Dr. Melina Schools  example of a  "Low GI"  Diet:  It will allow you to lose 4 to 8  lbs  per month if you follow it carefully.  Your goal with exercise is a minimum of 30 minutes of aerobic exercise 5 days per week (Walking does not count once it becomes easy!)    All of the foods can be found at grocery stores and in bulk at [employer].  The Atkins protein bars and shakes are available in more varieties at Target, WalMart and Lowe's Foods.     7 AM Breakfast:  Choose from the following:  Low carbohydrate Protein  Shakes (I recommend the  Premier Protein chocolate shakes,  EAS AdvantEdge "Carb Control" shakes  Or the Atkins shakes all are under 3 net carbs)     a scrambled egg/bacon/cheese burrito made with Mission's "carb balance" whole wheat tortilla  (about 10 net carbs )  Medical laboratory scientific officer (basically a quiche without the pastry crust) that is eaten cold and very convenient way to get your eggs.  8 carbs)  If you make your own protein shakes, avoid bananas and pineapple,  And use low carb greek yogurt or original /unsweetened almond or soy  milk    Avoid cereal and bananas, oatmeal and cream of wheat and grits. They are loaded with carbohydrates!   10 AM: high protein snack:  Protein bar by Atkins (the snack size, under 200 cal, usually < 6 net carbs).    A stick of cheese:  Around 1 carb,  100 cal     Dannon Light n Fit [place] Yogurt  (80 cal, 8 carbs)  Other so called "protein bars" and Greek yogurts tend to be loaded with carbohydrates.  Remember, in food advertising, the word "energy" is synonymous for " carbohydrate."  Lunch:   A Sandwich using the bread choices listed, Can use any  Eggs,  lunchmeat, grilled meat or canned tuna), avocado, regular mayo/mustard  and cheese.  A Salad using blue cheese, ranch,  Goddess or vinagrette,  Avoid taco shells, croutons or "confetti" and no "candied nuts" but regular nuts OK.   No pretzels, nabs  or chips.  Pickles and miniature sweet peppers are a good low carb alternative that provide a "crunch"  The bread is the only source of carbohydrate in a sandwich and  can be decreased by trying some of the attached alternatives to traditional loaf bread   Avoid "Low fat dressings, as well as Reyne Dumas and Smith[employer] Foods dressings They are loaded with sugar!   3 PM/  Mid day  Snack:  Consider  1 ounce of  almonds, walnuts, pistachios, pecans, peanuts,  Macadamia nuts or a nut medley.  Avoid "granola and granola bars "  Mixed nuts are ok in moderation as long as there are no raisins,  cranberries or dried fruit.   KIND bars are OK if you get the low glycemic index variety   Try the prosciutto/mozzarella cheese sticks by Fiorruci  In deli /backery section   High protein      6 PM  Dinner:     Meat/fowl/fish with a green salad, and either broccoli, cauliflower, green beans, spinach, brussel sprouts or  Lima beans. DO NOT BREAD THE PROTEIN!!      There is a low carb pasta by Dreamfield's that is acceptable and tastes great: only 5 digestible carbs/serving.( All grocery stores but BJs carry it  ) Several ready made meals are available low carb:   Try Michel Angelo's chicken piccata or chicken or eggplant parm over low carb pasta.(Lowes and BJs)   Clifton Custard Sanchez's "Carnitas" (pulled pork, no sauce,  0 carbs) or his beef pot roast to make a dinner burrito (at BJ's)  Pesto over low carb pasta (bj's sells a good quality pesto in the center refrigerated section of the deli   Try satueeing  Roosvelt Harps with mushroooms as a good side   Green Giant makes a mashed cauliflower that tastes like mashed potatoes  Whole wheat pasta is still full of digestible carbs and  Not as low in glycemic index as Dreamfield's.   Brown rice is still rice,  So skip the rice and noodles if you eat [place] or [place] (or at least limit to 1/2 cup)  9 PM snack :   Breyer's "low carb" fudgsicle or  ice cream bar (Carb Smart line), or  Weight Watcher's ice cream bar , or another "no sugar added" ice cream;  a serving of fresh berries/cherries with whipped cream   Cheese or DANNON'S LlGHT N FIT GREEK YOGURT  8 ounces of [place] Diamond unsweetened almond/cococunut milk    Treat yourself to a parfait made with whipped cream blueberiies, walnuts and vanilla greek yogurt  Avoid bananas, pineapple, grapes  and watermelon on a regular basis because they are high in sugar.  THINK OF THEM AS DESSERT  Remember that snack Substitutions should be less than 10 NET carbs per serving and meals < 20 carbs. Remember to subtract fiber grams to get the "net carbs."   Health Maintenance, Male Adopting a healthy lifestyle and getting preventive care are important in promoting health and wellness. Ask your health care provider about:  The right schedule for you to have regular tests and exams.  Things you can do on your own to prevent diseases and keep yourself healthy. What should I know about diet, weight, and exercise? Eat a healthy diet  Eat a diet that includes plenty of vegetables, fruits, low-fat dairy products, and lean  protein.  Do not eat a lot of foods that are high in solid fats, added sugars, or sodium.   Maintain a healthy weight Body mass index (BMI) is a measurement that can be used to identify possible weight problems. It estimates body fat based on height and weight. Your health care provider can help determine your BMI and help you achieve or maintain a healthy weight. Get regular exercise Get regular exercise. This is one of the most important things you can do for your health. Most adults should:  Exercise  for at least 150 minutes each week. The exercise should increase your heart rate and make you sweat (moderate-intensity exercise).  Do strengthening exercises at least twice a week. This is in addition to the moderate-intensity exercise.  Spend less time sitting. Even light physical activity can be beneficial. Watch cholesterol and blood lipids Have your blood tested for lipids and cholesterol at 46 years of age, then have this test every 5 years. You may need to have your cholesterol levels checked more often if:  Your lipid or cholesterol levels are high.  You are older than 45 years of age.  You are at high risk for heart disease. What should I know about cancer screening? Many types of cancers can be detected early and may often be prevented. Depending on your health history and family history, you may need to have cancer screening at various ages. This may include screening for:  Colorectal cancer.  Prostate cancer.  Skin cancer.  Lung cancer. What should I know about heart disease, diabetes, and high blood pressure? Blood pressure and heart disease  High blood pressure causes heart disease and increases the risk of stroke. This is more likely to develop in people who have high blood pressure readings, are of African descent, or are overweight.  Talk with your health care provider about your target blood pressure readings.  Have your blood pressure checked: ? Every 3-5  years if you are 33-63 years of age. ? Every year if you are 28 years old or older.  If you are between the ages of 62 and 13 and are a current or former smoker, ask your health care provider if you should have a one-time screening for abdominal aortic aneurysm (AAA). Diabetes Have regular diabetes screenings. This checks your fasting blood sugar level. Have the screening done:  Once every three years after age 54 if you are at a normal weight and have a low risk for diabetes.  More often and at a younger age if you are overweight or have a high risk for diabetes. What should I know about preventing infection? Hepatitis B If you have a higher risk for hepatitis B, you should be screened for this virus. Talk with your health care provider to find out if you are at risk for hepatitis B infection. Hepatitis C Blood testing is recommended for:  Everyone born from [birth year] through 1965.  Anyone with known risk factors for hepatitis C. Sexually transmitted infections (STIs)  You should be screened each year for STIs, including gonorrhea and chlamydia, if: ? You are sexually active and are younger than 45 years of age. ? You are older than 45 years of age and your health care provider tells you that you are at risk for this type of infection. ? Your sexual activity has changed since you were last screened, and you are at increased risk for chlamydia or gonorrhea. Ask your health care provider if you are at risk.  Ask your health care provider about whether you are at high risk for HIV. Your health care provider may recommend a prescription medicine to help prevent HIV infection. If you choose to take medicine to prevent HIV, you should first get tested for HIV. You should then be tested every 3 months for as long as you are taking the medicine. Follow these instructions at home: Lifestyle  Do not use any products that contain nicotine or tobacco, such as cigarettes, e-cigarettes, and chewing  tobacco. If you need help quitting, ask your  health care provider.  Do not use street drugs.  Do not share needles.  Ask your health care provider for help if you need support or information about quitting drugs. Alcohol use  Do not drink alcohol if your health care provider tells you not to drink.  If you drink alcohol: ? Limit how much you have to 0-2 drinks a day. ? Be aware of how much alcohol is in your drink. In the [place] one drink equals one 12 oz bottle of beer (355 mL), one 5 oz glass of wine (148 mL), or one 1 oz glass of hard liquor (44 mL). General instructions  Schedule regular health, dental, and eye exams.  Stay current with your vaccines.  Tell your health care provider if: ? You often feel depressed. ? You have ever been abused or do not feel safe at home. Summary  Adopting a healthy lifestyle and getting preventive care are important in promoting health and wellness.  Follow your health care provider's instructions about healthy diet, exercising, and getting tested or screened for diseases.  Follow your health care provider's instructions on monitoring your cholesterol and blood pressure. This information is not intended to replace advice given to you by your health care provider. Make sure you discuss any questions you have with your health care provider. Document Revised: 01/28/2018 Document Reviewed: 01/28/2018 Elsevier Patient Education  2021 [employer]

## 2020-06-01 NOTE — Assessment & Plan Note (Signed)
No contraindications to starting wegovy and counseled in regards to black box warning related to thyroid cancer. Educated on low glycemic diet and increase of exercise.

## 2020-06-01 NOTE — Assessment & Plan Note (Signed)
Deferred prostate exam or PSA as he is following with urology. Colonoscopy referral in place. Reviewed labs with patient today as done by employer. Agreed that weight loss in setting of elevated LDL cholesterol and prediabetes. TSH included today.

## 2020-06-05 ENCOUNTER — Encounter: Payer: BC Managed Care – PPO | Admitting: Family

## 2020-06-06 ENCOUNTER — Telehealth: Payer: Self-pay | Admitting: Family

## 2020-06-06 NOTE — Telephone Encounter (Signed)
Patient called and said his pharmacy told him he needed a prior autho on his Semaglutide-Weight Management (WEGOVY) 0.25 MG/0.5ML SOAJ

## 2020-06-07 NOTE — Telephone Encounter (Signed)
I called patient to let him know that I will be attempting PA. I did warn that this has proven hard to get approved, but I will let him know outcome.

## 2020-06-12 ENCOUNTER — Encounter: Payer: Self-pay | Admitting: Family

## 2020-07-18 ENCOUNTER — Ambulatory Visit: Payer: BC Managed Care – PPO | Admitting: Family

## 2020-07-18 ENCOUNTER — Other Ambulatory Visit: Payer: Self-pay

## 2020-07-18 ENCOUNTER — Encounter: Payer: Self-pay | Admitting: Family

## 2020-07-18 VITALS — BP 118/88 | HR 63 | Temp 96.9°F | Resp 14 | Ht 66.0 in | Wt 232.4 lb

## 2020-07-18 DIAGNOSIS — E669 Obesity, unspecified: Secondary | ICD-10-CM | POA: Diagnosis not present

## 2020-07-18 DIAGNOSIS — R590 Localized enlarged lymph nodes: Secondary | ICD-10-CM

## 2020-07-18 MED ORDER — METFORMIN HCL ER 500 MG PO TB24: ORAL_TABLET | ORAL | 3 refills | Status: DC

## 2020-07-18 NOTE — Progress Notes (Signed)
Subjective:    Patient ID: Gary Pace, male    DOB: [birth year], 45 y.o.   MRN: [id number]  CC: Gary Pace is a 45 y.o. male who presents today for follow up.   HPI: Here to discuss weight loss. He was unable to fill wegovy.   Very active, works in yard.   Started Truvy 6 weeks ago which contains Alpha Lipoic Acid , Magnesium, Raspberry Ketones, and Cinnamon and not sure if helping.   Noticed mass front of his neck for one year or more. Feels like near his '[place] area.'  Nontender, not draining. No trouble or pain with swallowing, epigastric burning, fever, congestion , PND, hoarseness.   Every once and a while dry cough . No cough today.  No h/o asthma       HISTORY:  Past Medical History:  Diagnosis Date  . Enlarged prostate    Followed by Dr. Achilles Dunk   Past Surgical History:  Procedure Laterality Date  . CARPAL TUNNEL RELEASE Right    [facility]  . TONSILLECTOMY AND ADENOIDECTOMY  2008   Family History  Problem Relation Age of Onset  . Thyroid disease Brother   . Cancer Paternal Grandfather 37       prostate cancer  . Stroke Paternal Grandfather   . Diabetes Paternal Grandfather   . Other Paternal Grandfather   . Heart attack Paternal Grandfather   . Cancer Paternal Aunt        breast, brain  . Other Mother        unknown medical history  . Other Father        unknown medical history  . AAA (abdominal aortic aneurysm) Paternal Aunt   . Thyroid cancer Neg Hx     Allergies: Patient has no known allergies. Current Outpatient Medications on File Prior to Visit  Medication Sig Dispense Refill  . pantoprazole (PROTONIX) 20 MG tablet Take 1 tablet (20 mg total) by mouth daily. 90 tablet 1  . sildenafil (REVATIO) 20 MG tablet Take 1 tablet (20 mg total) by mouth daily as needed. Take 1-5 tabs about one hour before sexual activity (Patient not taking: No sig reported) 30 tablet 6  . [DISCONTINUED] fluticasone (FLONASE) 50 MCG/ACT nasal spray Place into  both nostrils daily as needed for allergies or rhinitis.    . [DISCONTINUED] loratadine (CLARITIN) 10 MG tablet Take 10 mg by mouth daily as needed for allergies.     No current facility-administered medications on file prior to visit.    Social History   Tobacco Use  . Smoking status: Never Smoker  . Smokeless tobacco: Never Used  Vaping Use  . Vaping Use: Never used  Substance Use Topics  . Alcohol use: No    Alcohol/week: 0.0 standard drinks  . Drug use: No    Review of Systems  Constitutional: Negative for chills and fever.  HENT: Negative for congestion, trouble swallowing and voice change.   Respiratory: Negative for cough and shortness of breath.   Cardiovascular: Negative for chest pain and palpitations.  Gastrointestinal: Negative for nausea and vomiting.      Objective:    BP 118/88 (BP Location: Left Arm, Patient Position: Sitting, Cuff Size: Large)   Pulse 63   Temp (!) 96.9 F (36.1 C) (Temporal)   Resp 14   Ht 5\' 6"  (1.676 m)   Wt 232 lb 6.4 oz (105.4 kg)   SpO2 97%   BMI 37.51 kg/m  BP Readings from Last 3  Encounters:  07/18/20 118/88  06/01/20 108/68  05/05/20 (!) 145/85   Wt Readings from Last 3 Encounters:  07/18/20 232 lb 6.4 oz (105.4 kg)  06/01/20 233 lb 12.8 oz (106.1 kg)  05/05/20 239 lb (108.4 kg)    Physical Exam Vitals reviewed.  Constitutional:      Appearance: He is well-developed.  Neck:     Thyroid: No thyroid mass, thyromegaly or thyroid tenderness.      Comments: prominence over laryngeal prominence over thyroid Cardiovascular:     Rate and Rhythm: Regular rhythm.     Heart sounds: Normal heart sounds.  Pulmonary:     Effort: Pulmonary effort is normal. No respiratory distress.     Breath sounds: Normal breath sounds. No wheezing, rhonchi or rales.  Skin:    General: Skin is warm and dry.  Neurological:     Mental Status: He is alert.  Psychiatric:        Speech: Speech normal.        Behavior: Behavior normal.         Assessment & Plan:   Problem List Items Addressed This Visit      Immune and Lymphatic   Enlarged lymph node in neck    prominence over laryngeal prominence over thyroid. Pending US thyroid to evalulate for thyroid mass, lymph node  He declines ENT consult today for second opinion. We wil consider this is US thyroid unrevealing.       Relevant Orders   US THYROID     Other   Obesity (BMI 30.0-34.9) - Primary    Chronic, stable. Start metformin 500mg  xr and advised how to titrate up to 2000mg /day      Relevant Medications   metFORMIN (GLUCOPHAGE XR) 500 MG 24 hr tablet       I have discontinued B. Uphoff "JB Baune"'s albuterol and . I am also having him start on metFORMIN. Additionally, I am having him maintain his sildenafil and pantoprazole.   Meds ordered this encounter  Medications  . metFORMIN (GLUCOPHAGE XR) 500 MG 24 hr tablet    Sig: Start 500mg  PO qpm.    Dispense:  90 tablet    Refill:  3    Order Specific Question:   Supervising Provider    Answer:   Fayrene Fearing [[id number]]    Return precautions given.   Risks, benefits, and alternatives of the medications and treatment plan prescribed today were discussed, and patient expressed understanding.   Education regarding symptom management and diagnosis given to patient on AVS.  Continue to follow with [employer], FNP for routine health maintenance.   and I agreed with plan.   Sherlene Shams, FNP

## 2020-07-18 NOTE — Assessment & Plan Note (Signed)
prominence over laryngeal prominence over thyroid. Pending US thyroid to evalulate for thyroid mass, lymph node  He declines ENT consult today for second opinion. We wil consider this is US thyroid unrevealing.

## 2020-07-18 NOTE — Patient Instructions (Addendum)
Ultrasound of thyroid ordered  Let us know if you dont hear back within a week in regards to an appointment being scheduled.   Please call to schedule colonoscopy as discussed  Lets trial metformin  Start metformin XR with one 500mg  tablet at night. After one week, you may increase to two tablets at night ( total of 1000mg ) . The third week, you may take take two tablets at night and one tablet in the morning.  The fourth week, you may take two tablets in the morning ( 1000mg  total) and two tablets at night (1000mg  total). This will bring you to a maximum daily dose of 2000mg /day which is maximum dose. Along the way, if you want to increase more slowly, please do as this medication can cause GI discomfort and loose stools which usually get better with time , however some patients find that they can only tolerate a certain dose and cannot increase to maximum dose.

## 2020-07-19 NOTE — Assessment & Plan Note (Signed)
Chronic, stable. Start metformin 500mg  xr and advised how to titrate up to 2000mg /day

## 2020-08-18 ENCOUNTER — Other Ambulatory Visit: Payer: Self-pay

## 2020-08-18 ENCOUNTER — Ambulatory Visit
Admission: RE | Admit: 2020-08-18 | Discharge: 2020-08-18 | Disposition: A | Discharge status: Home / Self Care | Payer: BC Managed Care – PPO | Source: Ambulatory Visit | Attending: Family | Admitting: Family

## 2020-08-18 DIAGNOSIS — R590 Localized enlarged lymph nodes: Secondary | ICD-10-CM | POA: Insufficient documentation

## 2020-08-18 DIAGNOSIS — E041 Nontoxic single thyroid nodule: Secondary | ICD-10-CM | POA: Diagnosis not present

## 2020-08-23 ENCOUNTER — Other Ambulatory Visit: Payer: Self-pay | Admitting: Family

## 2020-08-23 DIAGNOSIS — E041 Nontoxic single thyroid nodule: Secondary | ICD-10-CM

## 2020-09-11 ENCOUNTER — Ambulatory Visit: Payer: BC Managed Care – PPO | Admitting: Internal Medicine

## 2020-09-11 ENCOUNTER — Encounter: Payer: Self-pay | Admitting: Internal Medicine

## 2020-09-11 ENCOUNTER — Other Ambulatory Visit: Payer: Self-pay

## 2020-09-11 VITALS — BP 116/76 | HR 50 | Ht 66.0 in | Wt 232.0 lb

## 2020-09-11 DIAGNOSIS — E041 Nontoxic single thyroid nodule: Secondary | ICD-10-CM

## 2020-09-11 NOTE — Progress Notes (Signed)
Name: Gary Pace  MRN/ DOB: [id number], 22-Feb-1975    Age/ Sex: 45 y.o., male    PCP: Gary Grana, FNP   Reason for Endocrinology Evaluation: Left thyroid  nodule      Date of Initial Endocrinology Evaluation: 09/11/2020     HPI: [name] is a 45 y.o. male with a past medical history of Prediabetes and GERD. The patient presented for initial endocrinology clinic visit on 09/11/2020 for consultative assistance with his Left thyroid nodule .   He has been noted with thyromegaly during physical exam which prompted a thyroid ultrasound demonstrating a 5.1 cm left thyroid nodule, meeting FNA criteria .   He is accompanied by Gary Pace   He has noted local neck swelling ! > 1 month ago    A couple of yrs ago has Esophageal dilatation  He denies dysphagia  Denies hoarseness  Denies prior exposure to radiation     Mother and brother with thyroid disease     HISTORY:  Past Medical History:  Past Medical History:  Diagnosis Date   Enlarged prostate    Followed by Dr. Achilles Pace   Past Surgical History:  Past Surgical History:  Procedure Laterality Date   CARPAL TUNNEL RELEASE Right    [facility]   TONSILLECTOMY AND ADENOIDECTOMY  2008    Social History:  reports that he has never smoked. He has never used smokeless tobacco. He reports that he does not drink alcohol and does not use drugs. Family History: family history includes AAA (abdominal aortic aneurysm) in his paternal aunt; Cancer in his paternal aunt; Cancer (age of onset: 53) in his paternal grandfather; Diabetes in his paternal grandfather; Heart attack in his paternal grandfather; Other in his father, mother, and paternal grandfather; Stroke in his paternal grandfather; Thyroid disease in his brother.   HOME MEDICATIONS: Allergies as of 09/11/2020   No Known Allergies      Medication List        Accurate as of September 11, 2020 11:10 AM. If you have any questions, ask your nurse or doctor.           metFORMIN 500 MG 24 hr tablet Commonly known as: Glucophage XR Start 500mg  PO qpm.   pantoprazole 20 MG tablet Commonly known as: PROTONIX Take 1 tablet (20 mg total) by mouth daily.   sildenafil 20 MG tablet Commonly known as: REVATIO Take 1 tablet (20 mg total) by mouth daily as needed. Take 1-5 tabs about one hour before sexual activity          REVIEW OF SYSTEMS: A comprehensive ROS was conducted with the patient and is negative except as per HPI   OBJECTIVE:  VS: BP 116/76 (BP Location: Left Arm, Patient Position: Sitting, Cuff Size: Large)   Pulse (!) 50   Ht 5\' 6"  (1.676 m)   Wt 232 lb (105.2 kg)   SpO2 98%   BMI 37.45 kg/m    Wt Readings from Last 3 Encounters:  09/11/20 232 lb (105.2 kg)  07/18/20 232 lb 6.4 oz (105.4 kg)  06/01/20 233 lb 12.8 oz (106.1 kg)     EXAM: General: Pt appears well and is in NAD  Neck: General: Supple without adenopathy. Thyroid: Left nodule appreciated   Lungs: Clear with good BS bilat with no rales, rhonchi, or wheezes  Heart: Auscultation: RRR.  Abdomen: Normoactive bowel sounds, soft, nontender, without masses or organomegaly palpable     Mental Status: Judgment, insight: Intact  Orientation: Oriented to time, place, and person Memory: Intact for recent and remote events Mood and affect: No depression, anxiety, or agitation     DATA REVIEWED: Results for Gary Pace, Gary Pace" (MRN [id number]) as of 09/12/2020 12:20  Ref. Range 06/01/2020 09:12  TSH Latest Ref Range: 0.35 - 4.50 uIU/mL 2.11     Thyroid ultrasound 08/18/2020 Estimated total number of nodules >/= 1 cm: 1   Number of spongiform nodules >/=  2 cm not described below (TR1): 0   Number of mixed cystic and solid nodules >/= 1.5 cm not described below (TR2): 0   _________________________________________________________   Nodule # 1:   Location: Left; mid   Maximum size: 5.1 cm; Other 2 dimensions: 3.6 x 3.3 cm   Composition: solid/almost  completely solid (2)   Echogenicity: isoechoic (1)   Shape: taller-than-wide (3)   Margins: ill-defined (0)   Echogenic foci: punctate echogenic foci (3)   ACR TI-RADS total points: 9.   ACR TI-RADS risk category: TR5 (>/= 7 points).   ACR TI-RADS recommendations:   **Given size (>/= 1.0 cm) and appearance, fine needle aspiration of this highly suspicious nodule should be considered based on TI-RADS criteria.   _________________________________________________________   IMPRESSION: TI-RADS 5 left thyroid nodule meets criteria for FNA.   ASSESSMENT/PLAN/RECOMMENDATIONS:   Left Thyroid Nodule :  - Pt is clinically and biochemically euthyroid  - No local neck symptoms  - We have reviewed thyroid ultrasound results, will proceed with FNA of the left nodule, which he is in agreement of.  - I have recommended left lobectomy due to size > 4 cm . - will revisit this after FNA results    F/U in 6 months   Signed electronically by: Gary Herrlich, MD  [facility]  [facility] [address] [place], [place] [address] Phone: [phone] FAX: [phone]   CC: Gary Grana, FNP [address] [place] [address] Phone: [phone] Fax: [phone]   Return to Endocrinology clinic as below: Future Appointments  Date Time Provider Department Center  10/30/2020  2:00 PM Gary Grana, FNP [facility]  05/03/2021  9:30 AM [facility] None  05/09/2021  1:00 PM Gary Come, MD [facility] None

## 2020-09-20 ENCOUNTER — Other Ambulatory Visit: Payer: Self-pay

## 2020-09-20 ENCOUNTER — Ambulatory Visit
Admission: RE | Admit: 2020-09-20 | Discharge: 2020-09-20 | Disposition: A | Discharge status: Home / Self Care | Payer: BC Managed Care – PPO | Source: Ambulatory Visit | Attending: Internal Medicine | Admitting: Internal Medicine

## 2020-09-20 DIAGNOSIS — E041 Nontoxic single thyroid nodule: Secondary | ICD-10-CM | POA: Insufficient documentation

## 2020-09-20 NOTE — Discharge Instructions (Signed)
Post Operative Instructions for Thyroid Biopsy  Keep pressure bandage over site of biopsy for 3-4 hours after leaving office.  Normal activity is permitted as long as it does not involve anything strenuous (i.e., jogging, heavy lifting, etc.).  Some minor pain may be experienced when the local anesthesia wears off, and should be relieved with Tylenol, Advil, or ibuprofen.  You may experience some bruising and/or swelling at the site of the biopsy.  If you should develop severe pain, difficulty swallowing or difficulty breathing, please call the office or [facility] at [phone].  Apply ice pack for 20 minutes this evening.      

## 2020-09-20 NOTE — Procedures (Signed)
Interventional Radiology Procedure Note  Procedure: US guided left thyroid nodule biopsy, single, with thyroseq.  Complications: None Recommendations:  - DC now  Signed,  Yvone Neu. Loreta Ave, DO

## 2020-09-21 LAB — CYTOLOGY - NON PAP

## 2020-10-13 ENCOUNTER — Encounter: Payer: Self-pay | Admitting: Family

## 2020-10-30 ENCOUNTER — Ambulatory Visit: Payer: BC Managed Care – PPO | Admitting: Family

## 2020-10-30 ENCOUNTER — Encounter: Payer: Self-pay | Admitting: Family

## 2020-10-30 ENCOUNTER — Other Ambulatory Visit: Payer: Self-pay

## 2020-10-30 VITALS — BP 118/70 | HR 52 | Temp 97.9°F | Ht 66.0 in | Wt 236.0 lb

## 2020-10-30 DIAGNOSIS — R7303 Prediabetes: Secondary | ICD-10-CM

## 2020-10-30 DIAGNOSIS — E559 Vitamin D deficiency, unspecified: Secondary | ICD-10-CM

## 2020-10-30 DIAGNOSIS — Z1159 Encounter for screening for other viral diseases: Secondary | ICD-10-CM

## 2020-10-30 DIAGNOSIS — E669 Obesity, unspecified: Secondary | ICD-10-CM

## 2020-10-30 DIAGNOSIS — E041 Nontoxic single thyroid nodule: Secondary | ICD-10-CM | POA: Insufficient documentation

## 2020-10-30 LAB — LIPID PANEL
Cholesterol: 169 mg/dL (ref 0–200)
HDL: 44.5 mg/dL (ref >39.00)
LDL Cholesterol: 105 mg/dL — ABNORMAL HIGH (ref 0–99)
NonHDL: 124.72
Total CHOL/HDL Ratio: 4
Triglycerides: 101 mg/dL (ref 0.0–149.0)
VLDL: 20.2 mg/dL (ref 0.0–40.0)

## 2020-10-30 LAB — VITAMIN D 25 HYDROXY (VIT D DEFICIENCY, FRACTURES): VITD: 23.96 ng/mL — ABNORMAL LOW (ref 30.00–100.00)

## 2020-10-30 LAB — HEMOGLOBIN A1C: Hgb A1c MFr Bld: 6.1 % (ref 4.6–6.5)

## 2020-10-30 NOTE — Patient Instructions (Addendum)
You are due for colonoscopy. Let me know if you would like me to order.   This is  Dr. Melina Schools  example of a  "Low GI"  Diet:  It will allow you to lose 4 to 8  lbs  per month if you follow it carefully.  Your goal with exercise is a minimum of 30 minutes of aerobic exercise 5 days per week (Walking does not count once it becomes easy!)    All of the foods can be found at grocery stores and in bulk at [employer].  The Atkins protein bars and shakes are available in more varieties at Target, WalMart and Lowe's Foods.     7 AM Breakfast:  Choose from the following:  Low carbohydrate Protein  Shakes (I recommend the  Premier Protein chocolate shakes,  EAS AdvantEdge "Carb Control" shakes  Or the Atkins shakes all are under 3 net carbs)     a scrambled egg/bacon/cheese burrito made with Mission's "carb balance" whole wheat tortilla  (about 10 net carbs )  Medical laboratory scientific officer (basically a quiche without the pastry crust) that is eaten cold and very convenient way to get your eggs.  8 carbs)  If you make your own protein shakes, avoid bananas and pineapple,  And use low carb greek yogurt or original /unsweetened almond or soy milk    Avoid cereal and bananas, oatmeal and cream of wheat and grits. They are loaded with carbohydrates!   10 AM: high protein snack:  Protein bar by Atkins (the snack size, under 200 cal, usually < 6 net carbs).    A stick of cheese:  Around 1 carb,  100 cal     Dannon Light n Fit [place] Yogurt  (80 cal, 8 carbs)  Other so called "protein bars" and Greek yogurts tend to be loaded with carbohydrates.  Remember, in food advertising, the word "energy" is synonymous for " carbohydrate."  Lunch:   A Sandwich using the bread choices listed, Can use any  Eggs,  lunchmeat, grilled meat or canned tuna), avocado, regular mayo/mustard  and cheese.  A Salad using blue cheese, ranch,  Goddess or vinagrette,  Avoid taco shells, croutons or "confetti" and no "candied  nuts" but regular nuts OK.   No pretzels, nabs  or chips.  Pickles and miniature sweet peppers are a good low carb alternative that provide a "crunch"  The bread is the only source of carbohydrate in a sandwich and  can be decreased by trying some of the attached alternatives to traditional loaf bread   Avoid "Low fat dressings, as well as Reyne Dumas and [employer] Foods dressings They are loaded with sugar!   3 PM/ Mid day  Snack:  Consider  1 ounce of  almonds, walnuts, pistachios, pecans, peanuts,  Macadamia nuts or a nut medley.  Avoid "granola and granola bars "  Mixed nuts are ok in moderation as long as there are no raisins,  cranberries or dried fruit.   KIND bars are OK if you get the low glycemic index variety   Try the prosciutto/mozzarella cheese sticks by Fiorruci  In deli /backery section   High protein      6 PM  Dinner:     Meat/fowl/fish with a green salad, and either broccoli, cauliflower, green beans, spinach, brussel sprouts or  Lima beans. DO NOT BREAD THE PROTEIN!!      There is a low carb pasta by Dreamfield's that is acceptable and tastes great:  only 5 digestible carbs/serving.( All grocery stores but BJs carry it ) Several ready made meals are available low carb:   Try Michel Angelo's chicken piccata or chicken or eggplant parm over low carb pasta.(Lowes and BJs)   Clifton Custard Sanchez's "Carnitas" (pulled pork, no sauce,  0 carbs) or his beef pot roast to make a dinner burrito (at BJ's)  Pesto over low carb pasta (bj's sells a good quality pesto in the center refrigerated section of the deli   Try satueeing  Roosvelt Harps with mushroooms as a good side   Green Giant makes a mashed cauliflower that tastes like mashed potatoes  Whole wheat pasta is still full of digestible carbs and  Not as low in glycemic index as Dreamfield's.   Brown rice is still rice,  So skip the rice and noodles if you eat [place] or [place] (or at least limit to 1/2 cup)  9 PM snack :   Breyer's "low carb"  fudgsicle or  ice cream bar (Carb Smart line), or  Weight Watcher's ice cream bar , or another "no sugar added" ice cream;  a serving of fresh berries/cherries with whipped cream   Cheese or DANNON'S LlGHT N FIT GREEK YOGURT  8 ounces of Blue Diamond unsweetened almond/cococunut milk    Treat yourself to a parfait made with whipped cream blueberiies, walnuts and vanilla greek yogurt  Avoid bananas, pineapple, grapes  and watermelon on a regular basis because they are high in sugar.  THINK OF THEM AS DESSERT  Remember that snack Substitutions should be less than 10 NET carbs per serving and meals < 20 carbs. Remember to subtract fiber grams to get the "net carbs."

## 2020-10-30 NOTE — Assessment & Plan Note (Signed)
Benign cytology 09/2020.  No further follow-up.

## 2020-10-30 NOTE — Progress Notes (Signed)
Subjective:    Patient ID: Gary Pace, male    DOB: 12-14-1975, 45 y.o.   MRN: [id number]  CC: Gary Pace is a 45 y.o. male who presents today for follow up.   HPI: Feels well today.  No complaints   obesity-he was unable to tolerate metformin due to loose stools.  Gary Pace was not approved by insurance.  He would like to work on dietary lifestyle changes.    recent thyroid nodule biopsy Gary Pace - benign cytology 09/2020    HISTORY:  Past Medical History:  Diagnosis Date   Enlarged prostate    Followed by Gary Pace   Past Surgical History:  Procedure Laterality Date   CARPAL TUNNEL RELEASE Right    Gary Pace   TONSILLECTOMY AND ADENOIDECTOMY  2008   Family History  Problem Relation Age of Onset   Thyroid disease Brother    Cancer Paternal Grandfather 36       prostate cancer   Stroke Paternal Grandfather    Diabetes Paternal Grandfather    Other Paternal Grandfather    Heart attack Paternal Grandfather    Cancer Paternal Aunt        breast, brain   Other Mother        unknown medical history   Other Father        unknown medical history   AAA (abdominal aortic aneurysm) Paternal Aunt    Thyroid cancer Neg Hx     Allergies: Patient has no known allergies. Current Outpatient Medications on File Prior to Visit  Medication Sig Dispense Refill   pantoprazole (PROTONIX) 20 MG tablet Take 1 tablet (20 mg total) by mouth daily. 90 tablet 1   sildenafil (REVATIO) 20 MG tablet Take 1 tablet (20 mg total) by mouth daily as needed. Take 1-5 tabs about one hour before sexual activity (Patient not taking: No sig reported) 30 tablet 6   [DISCONTINUED] fluticasone (FLONASE) 50 MCG/ACT nasal spray Place into both nostrils daily as needed for allergies or rhinitis.     [DISCONTINUED] loratadine (CLARITIN) 10 MG tablet Take 10 mg by mouth daily as needed for allergies.     No current facility-administered medications on file prior to visit.    Social History   Tobacco  Use   Smoking status: Never   Smokeless tobacco: Never  Vaping Use   Vaping Use: Never used  Substance Use Topics   Alcohol use: No    Alcohol/week: 0.0 standard drinks   Drug use: No    Review of Systems  Constitutional:  Negative for chills and fever.  Respiratory:  Negative for cough.   Cardiovascular:  Negative for chest pain and palpitations.  Gastrointestinal:  Negative for nausea and vomiting.     Objective:    BP 118/70 (BP Location: Left Arm, Patient Position: Sitting, Cuff Size: Large)   Pulse (!) 52   Temp 97.9 F (36.6 C) (Oral)   Ht 5\' 6"  (1.676 m)   Wt 236 lb (107 kg)   SpO2 97%   BMI 38.09 kg/m  BP Readings from Last 3 Encounters:  10/30/20 118/70  09/20/20 124/74  09/11/20 116/76   Wt Readings from Last 3 Encounters:  10/30/20 236 lb (107 kg)  09/11/20 232 lb (105.2 kg)  07/18/20 232 lb 6.4 oz (105.4 kg)    Physical Exam Vitals reviewed.  Constitutional:      Appearance: He is well-developed.  Cardiovascular:     Rate and Rhythm: Regular rhythm.  Heart sounds: Normal heart sounds.  Pulmonary:     Effort: Pulmonary effort is normal. No respiratory distress.     Breath sounds: Normal breath sounds. No wheezing, rhonchi or rales.  Skin:    General: Skin is warm and dry.  Neurological:     Mental Status: He is alert.  Psychiatric:        Speech: Speech normal.        Behavior: Behavior normal.       Assessment & Plan:   Problem List Items Addressed This Visit       Endocrine   Thyroid nodule    Benign cytology 09/2020.  No further follow-up.        Other   Obesity (BMI 30.0-34.9)    Counseled on the importance of a low glycemic diet.  Education provided on his after visit summary.      Other Visit Diagnoses     Prediabetes    -  Primary   Relevant Orders   Lipid panel   Hemoglobin A1c   Vitamin D deficiency       Relevant Orders   VITAMIN D 25 Hydroxy (Vit-D Deficiency, Fractures)   Encounter for hepatitis C screening  test for low risk patient       Relevant Orders   Hepatitis C antibody        I have discontinued Gary Fearing B. Housman "JB Miah"'s metFORMIN. I am also having him maintain his sildenafil and pantoprazole.   No orders of the defined types were placed in this encounter.   Return precautions given.   Risks, benefits, and alternatives of the medications and treatment plan prescribed today were discussed, and patient expressed understanding.   Education regarding symptom management and diagnosis given to patient on AVS.  Continue to follow with Gary Grana, FNP for routine health maintenance.   Gary Pace and I agreed with plan.   Gary Plowman, FNP

## 2020-10-30 NOTE — Assessment & Plan Note (Signed)
Counseled on the importance of a low glycemic diet.  Education provided on his after visit summary.

## 2020-10-31 LAB — HEPATITIS C ANTIBODY
Hepatitis C Ab: NONREACTIVE
SIGNAL TO CUT-OFF: 0.02 (ref <1.00)

## 2020-11-02 ENCOUNTER — Encounter: Payer: Self-pay | Admitting: Family

## 2020-11-03 ENCOUNTER — Other Ambulatory Visit: Payer: Self-pay | Admitting: Family

## 2020-11-03 DIAGNOSIS — Z1211 Encounter for screening for malignant neoplasm of colon: Secondary | ICD-10-CM

## 2020-11-03 NOTE — Progress Notes (Signed)
close

## 2020-11-06 ENCOUNTER — Other Ambulatory Visit: Payer: Self-pay

## 2020-11-06 DIAGNOSIS — Z1211 Encounter for screening for malignant neoplasm of colon: Secondary | ICD-10-CM

## 2020-11-06 MED ORDER — PEG 3350-KCL-NA BICARB-NACL 420 G PO SOLR: 4000.0000 mL | Freq: Once | ORAL | 0 refills | Status: AC

## 2020-11-06 NOTE — Progress Notes (Signed)
Gastroenterology Pre-Procedure Review  Request Date: 12/14/20 Requesting Physician: Dr. Allegra Lai  PATIENT REVIEW QUESTIONS: The patient responded to the following health history questions as indicated:    1. Are you having any GI issues? no 2. Do you have a personal history of Polyps? no 3. Do you have a family history of Colon Cancer or Polyps? no 4. Diabetes Mellitus? no 5. Joint replacements in the past 12 months?no 6. Major health problems in the past 3 months?no 7. Any artificial heart valves, MVP, or defibrillator?no    MEDICATIONS & ALLERGIES:    Patient reports the following regarding taking any anticoagulation/antiplatelet therapy:   Plavix, Coumadin, Eliquis, Xarelto, Lovenox, Pradaxa, Brilinta, or Effient? no Aspirin? no  Patient confirms/reports the following medications:  Current Outpatient Medications  Medication Sig Dispense Refill   pantoprazole (PROTONIX) 20 MG tablet Take 1 tablet (20 mg total) by mouth daily. 90 tablet 1   sildenafil (REVATIO) 20 MG tablet Take 1 tablet (20 mg total) by mouth daily as needed. Take 1-5 tabs about one hour before sexual activity (Patient not taking: No sig reported) 30 tablet 6   No current facility-administered medications for this visit.    Patient confirms/reports the following allergies:  No Known Allergies  No orders of the defined types were placed in this encounter.   AUTHORIZATION INFORMATION Primary Insurance: 1D#: Group #:  Secondary Insurance: 1D#: Group #:  SCHEDULE INFORMATION: Date: 12/14/20 Time: Location: [facility]

## 2020-11-27 ENCOUNTER — Other Ambulatory Visit: Payer: Self-pay

## 2020-11-27 ENCOUNTER — Encounter: Payer: Self-pay | Admitting: Gastroenterology

## 2020-12-07 ENCOUNTER — Other Ambulatory Visit: Payer: Self-pay | Admitting: Family

## 2020-12-14 ENCOUNTER — Ambulatory Visit: Payer: BC Managed Care – PPO | Admitting: Anesthesiology

## 2020-12-14 ENCOUNTER — Encounter: Payer: Self-pay | Admitting: Gastroenterology

## 2020-12-14 ENCOUNTER — Ambulatory Visit
Admission: RE | Admit: 2020-12-14 | Discharge: 2020-12-14 | Disposition: A | Discharge status: Home / Self Care | Payer: BC Managed Care – PPO | Attending: Gastroenterology | Admitting: Gastroenterology

## 2020-12-14 ENCOUNTER — Encounter
Admission: RE | Disposition: A | Discharge status: Home / Self Care | Payer: Self-pay | Source: Home / Self Care | Attending: Gastroenterology

## 2020-12-14 ENCOUNTER — Other Ambulatory Visit: Payer: Self-pay

## 2020-12-14 DIAGNOSIS — K644 Residual hemorrhoidal skin tags: Secondary | ICD-10-CM | POA: Insufficient documentation

## 2020-12-14 DIAGNOSIS — Z1211 Encounter for screening for malignant neoplasm of colon: Secondary | ICD-10-CM | POA: Insufficient documentation

## 2020-12-14 HISTORY — PX: COLONOSCOPY WITH PROPOFOL: SHX5780

## 2020-12-14 SURGERY — COLONOSCOPY WITH PROPOFOL
Anesthesia: General

## 2020-12-14 MED ORDER — LIDOCAINE HCL (CARDIAC) PF 100 MG/5ML IV SOSY
PREFILLED_SYRINGE | INTRAVENOUS | Status: DC | PRN
  Administered 2020-12-14: 30 mg via INTRAVENOUS

## 2020-12-14 MED ORDER — PROPOFOL 10 MG/ML IV BOLUS
INTRAVENOUS | Status: DC | PRN
  Administered 2020-12-14 (×3): 40 mg via INTRAVENOUS
  Administered 2020-12-14: 20 mg via INTRAVENOUS
  Administered 2020-12-14: 150 mg via INTRAVENOUS
  Administered 2020-12-14 (×2): 40 mg via INTRAVENOUS

## 2020-12-14 MED ORDER — SODIUM CHLORIDE 0.9 % IV SOLN: INTRAVENOUS | Status: DC

## 2020-12-14 MED ORDER — LACTATED RINGERS IV SOLN: INTRAVENOUS | Status: DC

## 2020-12-14 SURGICAL SUPPLY — 26 items
CLIP HMST 235XBRD CATH ROT (MISCELLANEOUS) IMPLANT
CLIP RESOLUTION 360 11X235 (MISCELLANEOUS)
ELECT REM PT RETURN 9FT ADLT (ELECTROSURGICAL)
ELECTRODE REM PT RTRN 9FT ADLT (ELECTROSURGICAL) IMPLANT
FCP ESCP3.2XJMB 240X2.8X (MISCELLANEOUS)
FORCEPS BIOP RAD 4 LRG CAP 4 (CUTTING FORCEPS) IMPLANT
FORCEPS BIOP RJ4 240 W/NDL (MISCELLANEOUS)
FORCEPS ESCP3.2XJMB 240X2.8X (MISCELLANEOUS) IMPLANT
GOWN CVR UNV OPN BCK APRN NK (MISCELLANEOUS) ×2 IMPLANT
GOWN ISOL THUMB LOOP REG UNIV (MISCELLANEOUS) ×4
INJECTOR VARIJECT VIN23 (MISCELLANEOUS) IMPLANT
KIT DEFENDO VALVE AND CONN (KITS) IMPLANT
KIT PRC NS LF DISP ENDO (KITS) ×1 IMPLANT
KIT PROCEDURE OLYMPUS (KITS) ×2
MANIFOLD NEPTUNE II (INSTRUMENTS) ×2 IMPLANT
MARKER SPOT ENDO TATTOO 5ML (MISCELLANEOUS) IMPLANT
PROBE APC STR FIRE (PROBE) IMPLANT
RETRIEVER NET ROTH 2.5X230 LF (MISCELLANEOUS) IMPLANT
SNARE COLD EXACTO (MISCELLANEOUS) IMPLANT
SNARE SHORT THROW 13M SML OVAL (MISCELLANEOUS) IMPLANT
SNARE SHORT THROW 30M LRG OVAL (MISCELLANEOUS) IMPLANT
SNARE SNG USE RND 15MM (INSTRUMENTS) IMPLANT
SPOT EX ENDOSCOPIC TATTOO (MISCELLANEOUS)
TRAP ETRAP POLY (MISCELLANEOUS) IMPLANT
VARIJECT INJECTOR VIN23 (MISCELLANEOUS)
WATER STERILE IRR 250ML POUR (IV SOLUTION) ×2 IMPLANT

## 2020-12-14 NOTE — Op Note (Signed)
[facility] Gastroenterology Patient Name: Gary Pace Procedure Date: 12/14/2020 8:14 AM MRN: [id number] Account #: [id number] Date of Birth: [birth year] Admit Type: Outpatient Age: 45 Room: [facility] OR ROOM 01 Gender: Male Note Status: Finalized Instrument Name: [id number] Procedure:             Colonoscopy Indications:           Screening for colorectal malignant neoplasm, This is                         the patient's first colonoscopy Providers:             Toney Reil MD, MD Referring MD:          Lyn Records. Arnett (Referring MD) Medicines:             General Anesthesia Complications:         No immediate complications. Estimated blood loss: None. Procedure:             Pre-Anesthesia Assessment:                        - Prior to the procedure, a History and Physical was                         performed, and patient medications and allergies were                         reviewed. The patient is competent. The risks and                         benefits of the procedure and the sedation options and                         risks were discussed with the patient. All questions                         were answered and informed consent was obtained.                         Patient identification and proposed procedure were                         verified by the physician, the nurse, the                         anesthesiologist, the anesthetist and the technician                         in the pre-procedure area in the procedure room in the                         endoscopy suite. Mental Status Examination: alert and                         oriented. Airway Examination: normal oropharyngeal                         airway and neck mobility. Respiratory Examination:  clear to auscultation. CV Examination: normal.                         Prophylactic Antibiotics: The patient does not require                         prophylactic antibiotics.  Prior Anticoagulants: The                         patient has taken no previous anticoagulant or                         antiplatelet agents. ASA Grade Assessment: II - A                         patient with mild systemic disease. After reviewing                         the risks and benefits, the patient was deemed in                         satisfactory condition to undergo the procedure. The                         anesthesia plan was to use general anesthesia.                         Immediately prior to administration of medications,                         the patient was re-assessed for adequacy to receive                         sedatives. The heart rate, respiratory rate, oxygen                         saturations, blood pressure, adequacy of pulmonary                         ventilation, and response to care were monitored                         throughout the procedure. The physical status of the                         patient was re-assessed after the procedure.                        After obtaining informed consent, the colonoscope was                         passed under direct vision. Throughout the procedure,                         the patient's blood pressure, pulse, and oxygen                         saturations were monitored continuously. The  Colonoscope was introduced through the anus and                         advanced to the the cecum, identified by appendiceal                         orifice and ileocecal valve. The colonoscopy was                         performed without difficulty. The patient tolerated                         the procedure well. The quality of the bowel                         preparation was evaluated using the [facility] Bowel                         Preparation Scale) with scores of: Right Colon = 3,                         Transverse Colon = 3 and Left Colon = 3 (entire mucosa                         seen well  with no residual staining, small fragments                         of stool or opaque liquid). The total BBPS score                         equals 9. Findings:      The perianal and digital rectal examinations were normal. Pertinent       negatives include normal sphincter tone and no palpable rectal lesions.      The entire examined colon appeared normal.      Non-bleeding external hemorrhoids were found during retroflexion. The       hemorrhoids were small. Impression:            - The entire examined colon is normal.                        - Non-bleeding external hemorrhoids.                        - No specimens collected. Recommendation:        - Discharge patient to home (with escort).                        - Resume previous diet today.                        - Continue present medications.                        - Repeat colonoscopy in 10 years for screening                         purposes.                        -  Return to my office at appointment to be scheduled                         to discuss about hemorrhoid ligation. Procedure Code(s):     --- Professional ---                        Z6109, Colorectal cancer screening; colonoscopy on                         individual not meeting criteria for high risk Diagnosis Code(s):     --- Professional ---                        Z12.11, Encounter for screening for malignant neoplasm                         of colon                        K64.4, Residual hemorrhoidal skin tags CPT copyright 2019 American Medical Association. All rights reserved. The codes documented in this report are preliminary and upon coder review may  be revised to meet current compliance requirements. Dr. Libby Maw Toney Reil MD, MD 12/14/2020 8:44:33 AM This report has been signed electronically. Number of Addenda: 0 Note Initiated On: 12/14/2020 8:14 AM Scope Withdrawal Time: 0 hours 9 minutes 53 seconds  Total Procedure Duration: 0 hours  11 minutes 33 seconds  Estimated Blood Loss:  Estimated blood loss: none.      [facility]

## 2020-12-14 NOTE — H&P (Signed)
Arlyss Repress, MD [address]  [address]  [place], [place] [address]  Main: [phone]  Fax: [phone] Pager: [phone]  Primary Care Physician:  Allegra Grana, FNP Primary Gastroenterologist:  Dr. Arlyss Repress  Pre-Procedure History & Physical: HPI:  Gary Pace is a 45 y.o. male is here for an colonoscopy.   Past Medical History:  Diagnosis Date   Enlarged prostate    Followed by Dr. Achilles Dunk    Past Surgical History:  Procedure Laterality Date   CARPAL TUNNEL RELEASE Right    [facility]   TONSILLECTOMY AND ADENOIDECTOMY  2008    Prior to Admission medications   Medication Sig Start Date End Date Taking? Authorizing Provider  pantoprazole (PROTONIX) 20 MG tablet TAKE 1 TABLET(20 MG) BY MOUTH DAILY 12/07/20  Yes Arnett, Lyn Records, FNP  sildenafil (REVATIO) 20 MG tablet Take 1 tablet (20 mg total) by mouth daily as needed. Take 1-5 tabs about one hour before sexual activity Patient not taking: No sig reported 05/07/19   Jerilee Field, MD  fluticasone [employer]) 50 MCG/ACT nasal spray Place into both nostrils daily as needed for allergies or rhinitis.  07/16/19  [provider]  loratadine (CLARITIN) 10 MG tablet Take 10 mg by mouth daily as needed for allergies.  07/16/19  [provider]    Allergies as of 11/06/2020   (No Known Allergies)    Family History  Problem Relation Age of Onset   Thyroid disease Brother    Cancer Paternal Grandfather 33       prostate cancer   Stroke Paternal Grandfather    Diabetes Paternal Grandfather    Other Paternal Grandfather    Heart attack Paternal Grandfather    Cancer Paternal Aunt        breast, brain   Other Mother        unknown medical history   Other Father        unknown medical history   AAA (abdominal aortic aneurysm) Paternal Aunt    Thyroid cancer Neg Hx     Social History   Socioeconomic History   Marital status: Single    Spouse name: Not on file   Number of  children: Not on file   Years of education: Not on file   Highest education level: Not on file  Occupational History   Not on file  Tobacco Use   Smoking status: Never   Smokeless tobacco: Never  Vaping Use   Vaping Use: Never used  Substance and Sexual Activity   Alcohol use: No    Alcohol/week: 0.0 standard drinks   Drug use: No   Sexual activity: Not on file  Other Topics Concern   Not on file  Social History Narrative   Lives in [place]. 2 dogs.      Work - [employer], Tour manager      Diet - regular      Exercise - none   Social Determinants of Corporate investment banker Strain: Not on file  Food Insecurity: Not on file  Transportation Needs: Not on file  Physical Activity: Not on file  Stress: Not on file  Social Connections: Not on file  Intimate Partner Violence: Not on file    Review of Systems: See HPI, otherwise negative ROS  Physical Exam: BP 128/69   Pulse (!) 48   Temp 97.7 F (36.5 C) (Temporal)   Wt 103 kg   SpO2 98%   BMI 36.64 kg/m  General:  Alert,  pleasant and cooperative in NAD Head:  Normocephalic and atraumatic. Neck:  Supple; no masses or thyromegaly. Lungs:  Clear throughout to auscultation.    Heart:  Regular rate and rhythm. Abdomen:  Soft, nontender and nondistended. Normal bowel sounds, without guarding, and without rebound.   Neurologic:  Alert and  oriented x4;  grossly normal neurologically.  Impression/Plan: Gary Pace is here for an colonoscopy to be performed for colon cancer screening  Risks, benefits, limitations, and alternatives regarding  colonoscopy have been reviewed with the patient.  Questions have been answered.  All parties agreeable.   Lannette Donath, MD  12/14/2020, 7:36 AM

## 2020-12-14 NOTE — Anesthesia Preprocedure Evaluation (Addendum)
Anesthesia Evaluation  Patient identified by MRN, date of birth, ID band Patient awake    Reviewed: Allergy & Precautions, H&P , NPO status , Patient's Chart, lab work & pertinent test results  Airway Mallampati: II  TM Distance: >3 FB Neck ROM: full    Dental no notable dental hx.    Pulmonary    Pulmonary exam normal breath sounds clear to auscultation       Cardiovascular Normal cardiovascular exam Rhythm:regular Rate:Normal     Neuro/Psych    GI/Hepatic GERD  ,  Endo/Other    Renal/GU      Musculoskeletal   Abdominal   Peds  Hematology   Anesthesia Other Findings   Reproductive/Obstetrics                             Anesthesia Physical Anesthesia Plan  ASA: 2  Anesthesia Plan: General   Post-op Pain Management:    Induction: Intravenous  PONV Risk Score and Plan: 2 and Treatment may vary due to age or medical condition, TIVA and Propofol infusion  Airway Management Planned: Natural Airway  Additional Equipment:   Intra-op Plan:   Post-operative Plan:   Informed Consent: I have reviewed the patients History and Physical, chart, labs and discussed the procedure including the risks, benefits and alternatives for the proposed anesthesia with the patient or authorized representative who has indicated his/her understanding and acceptance.     Dental Advisory Given  Plan Discussed with: CRNA  Anesthesia Plan Comments:         Anesthesia Quick Evaluation

## 2020-12-14 NOTE — Transfer of Care (Signed)
Immediate Anesthesia Transfer of Care Note  Patient: Gary Pace  Procedure(s) Performed: COLONOSCOPY WITH PROPOFOL  Patient Location: PACU  Anesthesia Type: General  Level of Consciousness: awake, alert  and patient cooperative  Airway and Oxygen Therapy: Patient Spontanous Breathing and Patient connected to supplemental oxygen  Post-op Assessment: Post-op Vital signs reviewed, Patient's Cardiovascular Status Stable, Respiratory Function Stable, Patent Airway and No signs of Nausea or vomiting  Post-op Vital Signs: Reviewed and stable  Complications: No notable events documented.

## 2020-12-14 NOTE — Anesthesia Procedure Notes (Signed)
Date/Time: 12/14/2020 8:27 AM Performed by: Maree Krabbe, CRNA Pre-anesthesia Checklist: Patient identified, Emergency Drugs available, Suction available, Timeout performed and Patient being monitored Patient Re-evaluated:Patient Re-evaluated prior to induction Oxygen Delivery Method: Nasal cannula Placement Confirmation: positive ETCO2

## 2020-12-14 NOTE — Anesthesia Postprocedure Evaluation (Signed)
Anesthesia Post Note  Patient: Gary Pace  Procedure(s) Performed: COLONOSCOPY WITH PROPOFOL     Patient location during evaluation: PACU Anesthesia Type: General Level of consciousness: awake and alert and oriented Pain management: satisfactory to patient Vital Signs Assessment: post-procedure vital signs reviewed and stable Respiratory status: spontaneous breathing, nonlabored ventilation and respiratory function stable Cardiovascular status: blood pressure returned to baseline and stable Postop Assessment: Adequate PO intake and No signs of nausea or vomiting Anesthetic complications: no   No notable events documented.  Cherly Beach

## 2020-12-19 ENCOUNTER — Other Ambulatory Visit: Payer: Self-pay

## 2020-12-19 ENCOUNTER — Ambulatory Visit
Admission: EM | Admit: 2020-12-19 | Discharge: 2020-12-19 | Disposition: A | Discharge status: Home / Self Care | Payer: BC Managed Care – PPO | Attending: Emergency Medicine | Admitting: Emergency Medicine

## 2020-12-19 DIAGNOSIS — Z8616 Personal history of COVID-19: Secondary | ICD-10-CM | POA: Diagnosis not present

## 2020-12-19 DIAGNOSIS — J069 Acute upper respiratory infection, unspecified: Secondary | ICD-10-CM | POA: Insufficient documentation

## 2020-12-19 DIAGNOSIS — Z79899 Other long term (current) drug therapy: Secondary | ICD-10-CM | POA: Insufficient documentation

## 2020-12-19 DIAGNOSIS — J029 Acute pharyngitis, unspecified: Secondary | ICD-10-CM | POA: Insufficient documentation

## 2020-12-19 DIAGNOSIS — Z20822 Contact with and (suspected) exposure to covid-19: Secondary | ICD-10-CM | POA: Insufficient documentation

## 2020-12-19 HISTORY — DX: Gastro-esophageal reflux disease without esophagitis: K21.9

## 2020-12-19 LAB — SARS CORONAVIRUS 2 (TAT 6-24 HRS): SARS Coronavirus 2: NEGATIVE

## 2020-12-19 MED ORDER — IPRATROPIUM BROMIDE 0.06 % NA SOLN: 2.0000 | Freq: Four times a day (QID) | NASAL | 12 refills | Status: DC

## 2020-12-19 NOTE — ED Provider Notes (Signed)
[facility]    CSN: [id number] Arrival date & time: 12/19/20  2202      History   Chief Complaint Chief Complaint  Patient presents with   Sore Throat    HPI Gary Pace is a 45 y.o. male.   HPI  45 year old male here for evaluation of sore throat.  Patient reports that he had a sore throat yesterday but it has resolved.  He states he had an elevated temp of 99.1 but no fever.  This resolved with ibuprofen.  He has had associated symptoms of mild nasal congestion and runny nose and chills.  The chills have resolved as well.  He denies ear pain, cough, shortness of breath or wheezing, or GI complaints.  Patient reports that his symptoms feel similar to when he had COVID in January 2021.  Taken at home COVID test which was negative.  Past Medical History:  Diagnosis Date   Enlarged prostate    Followed by Dr. Achilles Dunk   GERD (gastroesophageal reflux disease)     Patient Active Problem List   Diagnosis Date Noted   Colon cancer screening    Thyroid nodule 10/30/2020   Obesity (BMI 30.0-34.9) 06/01/2020   SOB (shortness of breath) on exertion 05/31/2019   Testicular pain, left 03/18/2018   Elevated blood pressure reading 03/18/2018   Routine physical examination 03/18/2018   Abscess 11/02/2016   Seasonal allergies 07/02/2015   Enlarged prostate 05/25/2012    Past Surgical History:  Procedure Laterality Date   CARPAL TUNNEL RELEASE Right    [facility]   COLONOSCOPY WITH PROPOFOL N/A 12/14/2020   Procedure: COLONOSCOPY WITH PROPOFOL;  Surgeon: Toney Reil, MD;  Location: [facility];  Service: Endoscopy;  Laterality: N/A;   TONSILLECTOMY AND ADENOIDECTOMY  2008       Home Medications    Prior to Admission medications   Medication Sig Start Date End Date Taking? Authorizing Provider  ipratropium (ATROVENT) 0.06 % nasal spray Place 2 sprays into both nostrils 4 (four) times daily. 12/19/20  Yes Becky Augusta, NP  pantoprazole (PROTONIX) 20  MG tablet TAKE 1 TABLET(20 MG) BY MOUTH DAILY 12/07/20   Allegra Grana, FNP  fluticasone [facility]) 50 MCG/ACT nasal spray Place into both nostrils daily as needed for allergies or rhinitis.  07/16/19  [provider]  loratadine (CLARITIN) 10 MG tablet Take 10 mg by mouth daily as needed for allergies.  07/16/19  [provider]    Family History Family History  Problem Relation Age of Onset   Thyroid disease Brother    Cancer Paternal Grandfather 47       prostate cancer   Stroke Paternal Grandfather    Diabetes Paternal Grandfather    Other Paternal Grandfather    Heart attack Paternal Grandfather    Cancer Paternal Aunt        breast, brain   Other Mother        unknown medical history   Other Father        unknown medical history   AAA (abdominal aortic aneurysm) Paternal Aunt    Thyroid cancer Neg Hx     Social History Social History   Tobacco Use   Smoking status: Never   Smokeless tobacco: Never  Vaping Use   Vaping Use: Never used  Substance Use Topics   Alcohol use: No    Alcohol/week: 0.0 standard drinks   Drug use: No     Allergies   Patient has no known allergies.  Review of Systems Review of Systems  Constitutional:  Positive for chills and fever. Negative for activity change and appetite change.  HENT:  Positive for congestion, rhinorrhea and sore throat. Negative for ear pain.   Respiratory:  Negative for cough, shortness of breath and wheezing.   Gastrointestinal:  Negative for abdominal pain, diarrhea, nausea and vomiting.  Skin:  Negative for rash.  Hematological: Negative.   Psychiatric/Behavioral: Negative.      Physical Exam Triage Vital Signs ED Triage Vitals  Enc Vitals Group     BP 12/19/20 0859 131/85     Pulse Rate 12/19/20 0859 (!) 57     Resp 12/19/20 0859 18     Temp 12/19/20 0859 98.2 F (36.8 C)     Temp Source 12/19/20 0859 Oral     SpO2 12/19/20 0859 98 %     Weight 12/19/20 0857 227 lb (103 kg)      Height 12/19/20 0857 5\' 6"  (1.676 m)     Head Circumference --      Peak Flow --      Pain Score 12/19/20 0857 0     Pain Loc --      Pain Edu? --      Excl. in GC? --    No data found.  Updated Vital Signs BP 131/85 (BP Location: Left Arm)   Pulse (!) 57   Temp 98.2 F (36.8 C) (Oral)   Resp 18   Ht 5\' 6"  (1.676 m)   Wt 227 lb (103 kg)   SpO2 98%   BMI 36.64 kg/m   Visual Acuity Right Eye Distance:   Left Eye Distance:   Bilateral Distance:    Right Eye Near:   Left Eye Near:    Bilateral Near:     Physical Exam Vitals and nursing note reviewed.  Constitutional:      General: He is not in acute distress.    Appearance: Normal appearance. He is not ill-appearing.  HENT:     Head: Normocephalic and atraumatic.     Right Ear: Tympanic membrane, ear canal and external ear normal. There is no impacted cerumen.     Left Ear: Tympanic membrane, ear canal and external ear normal. There is no impacted cerumen.     Nose: Congestion and rhinorrhea present.     Mouth/Throat:     Mouth: Mucous membranes are moist.     Pharynx: Oropharynx is clear. Posterior oropharyngeal erythema present.  Cardiovascular:     Rate and Rhythm: Normal rate and regular rhythm.     Pulses: Normal pulses.     Heart sounds: Normal heart sounds. No murmur heard.   No gallop.  Pulmonary:     Effort: Pulmonary effort is normal.     Breath sounds: Normal breath sounds. No wheezing, rhonchi or rales.  Musculoskeletal:     Cervical back: Normal range of motion and neck supple.  Lymphadenopathy:     Cervical: No cervical adenopathy.  Skin:    General: Skin is warm and dry.     Capillary Refill: Capillary refill takes less than 2 seconds.     Findings: No erythema or rash.  Neurological:     General: No focal deficit present.     Mental Status: He is alert and oriented to person, place, and time.  Psychiatric:        Mood and Affect: Mood normal.        Behavior: Behavior normal.  Thought Content: Thought content normal.        Judgment: Judgment normal.     UC Treatments / Results  Labs (all labs ordered are listed, but only abnormal results are displayed) Labs Reviewed  SARS CORONAVIRUS 2 (TAT 6-24 HRS)    EKG   Radiology No results found.  Procedures Procedures (including critical care time)  Medications Ordered in UC Medications - No data to display  Initial Impression / Assessment and Plan / UC Course  I have reviewed the triage vital signs and the nursing notes.  Pertinent labs & imaging results that were available during my care of the patient were reviewed by me and considered in my medical decision making (see chart for details).  Patient is a nontoxic-appearing 45 year old male here for evaluation of upper respiratory symptoms as outlined in the HPI above.  Patient states his symptoms are similar to when he had COVID back in January 2021 but he took a home COVID test which was negative.  Patient's physical exam reveals protegrin tympanic membranes bilaterally with normal light reflex and clear external auditory canals.  Nasal mucosa is mildly erythematous and edematous with clear nasal discharge.  Oropharyngeal exam reveals mild posterior oropharyngeal erythema with clear postnasal drip.  No cervical lymphadenopathy appreciated on exam.  Cardiopulmonary exam reveals clear lung sounds in all fields.  Will collect COVID PCR and discharge patient home to isolate pending the results.  I suspect patient has a viral illness and the COVID will be negative but he does have a history of obesity and colon cancer.  If you test positive you will need to quarantine for 5 days and we can treat him with molnupiravir.  Work note provided.  I have also given the patient Atrovent nasal spray to help with the nasal congestion.   Final Clinical Impressions(s) / UC Diagnoses   Final diagnoses:  Upper respiratory tract infection, unspecified type     Discharge  Instructions      Isolate at home pending the results of your COVID test.  If you test positive then you will have to quarantine for 5 days from the start of your symptoms.  After 5 days you can break quarantine if your symptoms have improved and you have not had a fever for 24 hours without taking Tylenol or ibuprofen.  Use over-the-counter Tylenol and ibuprofen as needed for body aches and fever.  Use the Atrovent nasal spray, 2 squirts in each nostril every 6 hours, as needed for nasal congestion.  If you develop any increased shortness of breath-especially at rest, you are unable to speak in full sentences, or is a late sign your lips are turning blue you need to go the ER for evaluation.      ED Prescriptions     Medication Sig Dispense Auth. Provider   ipratropium (ATROVENT) 0.06 % nasal spray Place 2 sprays into both nostrils 4 (four) times daily. 15 mL Becky Augusta, NP      PDMP not reviewed this encounter.   Becky Augusta, NP 12/19/20 [phone]

## 2020-12-19 NOTE — Discharge Instructions (Signed)
Isolate at home pending the results of your COVID test.  If you test positive then you will have to quarantine for 5 days from the start of your symptoms.  After 5 days you can break quarantine if your symptoms have improved and you have not had a fever for 24 hours without taking Tylenol or ibuprofen.  Use over-the-counter Tylenol and ibuprofen as needed for body aches and fever.  Use the Atrovent nasal spray, 2 squirts in each nostril every 6 hours, as needed for nasal congestion.  If you develop any increased shortness of breath-especially at rest, you are unable to speak in full sentences, or is a late sign your lips are turning blue you need to go the ER for evaluation.

## 2020-12-19 NOTE — ED Triage Notes (Signed)
Pt reports he had sore throat yesterday but is gone today. No fever. States he took COVID test and was negative. Also reports his forearms and hands became very cold yesterday. Today feels back to normal but wanted to be checked out.

## 2020-12-20 ENCOUNTER — Telehealth: Payer: Self-pay

## 2020-12-20 NOTE — Telephone Encounter (Signed)
I asked Gary Pace for help scheduling I dont have the clearance to overbook

## 2020-12-23 ENCOUNTER — Ambulatory Visit
Admission: EM | Admit: 2020-12-23 | Discharge: 2020-12-23 | Disposition: A | Discharge status: Home / Self Care | Payer: BC Managed Care – PPO | Attending: Emergency Medicine | Admitting: Emergency Medicine

## 2020-12-23 ENCOUNTER — Other Ambulatory Visit: Payer: Self-pay

## 2020-12-23 ENCOUNTER — Encounter: Payer: Self-pay | Admitting: Emergency Medicine

## 2020-12-23 DIAGNOSIS — J040 Acute laryngitis: Secondary | ICD-10-CM | POA: Diagnosis not present

## 2020-12-23 DIAGNOSIS — J029 Acute pharyngitis, unspecified: Secondary | ICD-10-CM | POA: Diagnosis not present

## 2020-12-23 DIAGNOSIS — R030 Elevated blood-pressure reading, without diagnosis of hypertension: Secondary | ICD-10-CM | POA: Diagnosis not present

## 2020-12-23 DIAGNOSIS — J0100 Acute maxillary sinusitis, unspecified: Secondary | ICD-10-CM

## 2020-12-23 LAB — GROUP A STREP BY PCR: Group A Strep by PCR: NOT DETECTED

## 2020-12-23 MED ORDER — AMOXICILLIN-POT CLAVULANATE 875-125 MG PO TABS: 1.0000 | ORAL_TABLET | Freq: Two times a day (BID) | ORAL | 0 refills | Status: AC

## 2020-12-23 NOTE — ED Provider Notes (Signed)
[facility]    CSN: [id number] Arrival date & time: 12/23/20  0093      History   Chief Complaint Chief Complaint  Patient presents with   Sore Throat   Fever    HPI Gary Pace is a 45 y.o. male.   45 year old male presents with sore throat, post nasal drainage and slight cough for over 1 week. Also experiencing low grade fevers off and on and sinus congestion with dark colored mucus. Denies any GI symptoms. Sinus pressure is getting worse and now has lost his voice. Was seen here on 12/19/20 with concern over possible COVID 19 infection. Had negative test and dx with probable viral illness. Was feeling better but now symptoms are worse. Was given Atrovent with minimal relief. Has also taken Ibuprofen, Dayquil and Nyquil with minimal relief. Other chronic health issues include GERD and currently on Protonix prn.   The history is provided by the patient.   Past Medical History:  Diagnosis Date   Enlarged prostate    Followed by Dr. Achilles Dunk   GERD (gastroesophageal reflux disease)     Patient Active Problem List   Diagnosis Date Noted   Colon cancer screening    Thyroid nodule 10/30/2020   Obesity (BMI 30.0-34.9) 06/01/2020   SOB (shortness of breath) on exertion 05/31/2019   Testicular pain, left 03/18/2018   Elevated blood pressure reading 03/18/2018   Routine physical examination 03/18/2018   Abscess 11/02/2016   Seasonal allergies 07/02/2015   Enlarged prostate 05/25/2012    Past Surgical History:  Procedure Laterality Date   CARPAL TUNNEL RELEASE Right    [facility]   COLONOSCOPY WITH PROPOFOL N/A 12/14/2020   Procedure: COLONOSCOPY WITH PROPOFOL;  Surgeon: Toney Reil, MD;  Location: [facility];  Service: Endoscopy;  Laterality: N/A;   TONSILLECTOMY AND ADENOIDECTOMY  2008       Home Medications    Prior to Admission medications   Medication Sig Start Date End Date Taking? Authorizing Provider  amoxicillin-clavulanate  (AUGMENTIN) 875-125 MG tablet Take 1 tablet by mouth every 12 (twelve) hours for 7 days. 12/23/20 12/30/20 Yes Lorilei Horan, Ali Lowe, NP  ipratropium (ATROVENT) 0.06 % nasal spray Place 2 sprays into both nostrils 4 (four) times daily. 12/19/20   Becky Augusta, NP  pantoprazole (PROTONIX) 20 MG tablet TAKE 1 TABLET(20 MG) BY MOUTH DAILY 12/07/20   Allegra Grana, FNP  fluticasone [facility]) 50 MCG/ACT nasal spray Place into both nostrils daily as needed for allergies or rhinitis.  07/16/19  [provider]  loratadine (CLARITIN) 10 MG tablet Take 10 mg by mouth daily as needed for allergies.  07/16/19  [provider]    Family History Family History  Problem Relation Age of Onset   Thyroid disease Brother    Cancer Paternal Grandfather 78       prostate cancer   Stroke Paternal Grandfather    Diabetes Paternal Grandfather    Other Paternal Grandfather    Heart attack Paternal Grandfather    Cancer Paternal Aunt        breast, brain   Other Mother        unknown medical history   Other Father        unknown medical history   AAA (abdominal aortic aneurysm) Paternal Aunt    Thyroid cancer Neg Hx     Social History Social History   Tobacco Use   Smoking status: Never   Smokeless tobacco: Never  Vaping Use  Vaping Use: Never used  Substance Use Topics   Alcohol use: No    Alcohol/week: 0.0 standard drinks   Drug use: No     Allergies   Patient has no known allergies.   Review of Systems Review of Systems  Constitutional:  Positive for fatigue and fever. Negative for activity change, appetite change and chills.  HENT:  Positive for congestion, postnasal drip, sinus pressure, sinus pain, sore throat and voice change. Negative for ear discharge, ear pain, facial swelling, mouth sores, nosebleeds and trouble swallowing.   Eyes:  Negative for pain, discharge, redness and itching.  Respiratory:  Positive for cough. Negative for chest tightness, shortness of  breath and wheezing.   Gastrointestinal:  Negative for diarrhea, nausea and vomiting.  Musculoskeletal:  Negative for arthralgias, myalgias, neck pain and neck stiffness.  Skin:  Negative for color change and rash.  Allergic/Immunologic: Negative for food allergies and immunocompromised state.  Neurological:  Positive for headaches. Negative for dizziness, seizures, syncope, weakness, light-headedness and numbness.  Hematological:  Negative for adenopathy. Does not bruise/bleed easily.    Physical Exam Triage Vital Signs ED Triage Vitals  Enc Vitals Group     BP 12/23/20 0848 (!) 153/94     Pulse Rate 12/23/20 0848 64     Resp 12/23/20 0848 16     Temp 12/23/20 0848 98.5 F (36.9 C)     Temp Source 12/23/20 0848 Oral     SpO2 12/23/20 0848 98 %     Weight 12/23/20 0846 227 lb (103 kg)     Height 12/23/20 0846 5\' 6"  (1.676 m)     Head Circumference --      Peak Flow --      Pain Score 12/23/20 0845 5     Pain Loc --      Pain Edu? --      Excl. in GC? --    No data found.  Updated Vital Signs BP (!) 153/94 (BP Location: Left Arm)   Pulse 64   Temp 98.5 F (36.9 C) (Oral)   Resp 16   Ht 5\' 6"  (1.676 m)   Wt 227 lb (103 kg)   SpO2 98%   BMI 36.64 kg/m   Visual Acuity Right Eye Distance:   Left Eye Distance:   Bilateral Distance:    Right Eye Near:   Left Eye Near:    Bilateral Near:     Physical Exam Vitals and nursing note reviewed.  Constitutional:      General: He is awake. He is not in acute distress.    Appearance: He is well-developed and well-groomed. He is ill-appearing.     Comments: He is sitting in the exam chair in no acute distress but appears tired and ill.   HENT:     Head: Normocephalic and atraumatic.     Right Ear: Hearing, ear canal and external ear normal. Tympanic membrane is bulging. Tympanic membrane is not injected or erythematous.     Left Ear: Hearing, ear canal and external ear normal. Tympanic membrane is bulging. Tympanic membrane  is not injected or erythematous.     Nose: Congestion present.     Right Sinus: Maxillary sinus tenderness and frontal sinus tenderness present.     Left Sinus: Maxillary sinus tenderness and frontal sinus tenderness present.     Mouth/Throat:     Lips: Pink.     Mouth: Mucous membranes are moist.     Pharynx: Uvula midline. Oropharyngeal exudate and posterior  oropharyngeal erythema present. No pharyngeal swelling or uvula swelling.     Comments: Yellow post nasal drainage present Eyes:     Extraocular Movements: Extraocular movements intact.     Conjunctiva/sclera: Conjunctivae normal.  Cardiovascular:     Rate and Rhythm: Normal rate and regular rhythm.     Heart sounds: Normal heart sounds. No murmur heard. Pulmonary:     Effort: Pulmonary effort is normal. No respiratory distress.     Breath sounds: Normal breath sounds and air entry. No decreased air movement. No decreased breath sounds, wheezing, rhonchi or rales.  Musculoskeletal:     Cervical back: Normal range of motion and neck supple.  Lymphadenopathy:     Cervical: No cervical adenopathy.  Skin:    General: Skin is warm and dry.     Findings: No rash.  Neurological:     General: No focal deficit present.     Mental Status: He is alert and oriented to person, place, and time.  Psychiatric:        Mood and Affect: Mood normal.        Behavior: Behavior normal. Behavior is cooperative.        Thought Content: Thought content normal.        Judgment: Judgment normal.     UC Treatments / Results  Labs (all labs ordered are listed, but only abnormal results are displayed) Labs Reviewed  GROUP A STREP BY PCR    EKG   Radiology No results found.  Procedures Procedures (including critical care time)  Medications Ordered in UC Medications - No data to display  Initial Impression / Assessment and Plan / UC Course  I have reviewed the triage vital signs and the nursing notes.  Pertinent labs & imaging results  that were available during my care of the patient were reviewed by me and considered in my medical decision making (see chart for details).     Reviewed negative strep test with patient. Discussed that he initially had a viral illness on 12/19/20 but with symptoms first improving and now worsening and more sinus pressure and congestion, he probably has a mild sinus infection with post nasal drainage that is irritating his throat. Will treat for possible secondary bacterial infection. Start Augmentin 875mg  twice a day as directed. May continue OTC Dayquil and Nyquil or similar medication to help with congestion. Continue to monitor blood pressure- decongestants can raise blood pressure. May continue Ibuprofen 600mg  every 8 hours as needed for pain. Follow-up in 4 to 5 days with his PCP if not improving.  Final Clinical Impressions(s) / UC Diagnoses   Final diagnoses:  Acute non-recurrent maxillary sinusitis  Sore throat  Laryngitis, acute  Elevated blood pressure reading in office without diagnosis of hypertension     Discharge Instructions      Recommend start Augmentin 875mg  twice a day as directed. May continue OTC Dayquil and Nyquil or similar medication to help with congestion. May continue Ibuprofen 600mg  every 8 hours as needed for pain. Follow-up in 4 to 5 days with your PCP if not improving.      ED Prescriptions     Medication Sig Dispense Auth. Provider   amoxicillin-clavulanate (AUGMENTIN) 875-125 MG tablet Take 1 tablet by mouth every 12 (twelve) hours for 7 days. 14 tablet Ryelan Kazee, , NP      PDMP not reviewed this encounter.   , NP 12/23/20 2034

## 2020-12-23 NOTE — ED Triage Notes (Signed)
Patient c/o sore throat, fever and post nasal drip for a week.  Patient was seen on 12/19/20 and COVID test was negative.

## 2020-12-23 NOTE — Discharge Instructions (Addendum)
Recommend start Augmentin 875mg  twice a day as directed. May continue OTC Dayquil and Nyquil or similar medication to help with congestion. May continue Ibuprofen 600mg  every 8 hours as needed for pain. Follow-up in 4 to 5 days with your PCP if not improving.

## 2020-12-28 ENCOUNTER — Encounter: Payer: Self-pay | Admitting: Gastroenterology

## 2020-12-28 ENCOUNTER — Ambulatory Visit: Payer: BC Managed Care – PPO | Admitting: Gastroenterology

## 2021-01-01 ENCOUNTER — Ambulatory Visit: Payer: BC Managed Care – PPO | Admitting: Internal Medicine

## 2021-01-01 ENCOUNTER — Other Ambulatory Visit: Payer: Self-pay

## 2021-01-01 ENCOUNTER — Encounter: Payer: Self-pay | Admitting: Internal Medicine

## 2021-01-01 VITALS — BP 110/82 | HR 60 | Ht 66.0 in | Wt 237.0 lb

## 2021-01-01 DIAGNOSIS — E041 Nontoxic single thyroid nodule: Secondary | ICD-10-CM

## 2021-01-01 NOTE — Progress Notes (Signed)
Name: Gary Pace  MRN/ DOB: [id number], 1975-12-31    Age/ Sex: 45 y.o., male     PCP: Gary Grana, FNP   Reason for Endocrinology Evaluation: Left thyroid nodule     Initial Endocrinology Clinic Visit: 09/11/2020    PATIENT IDENTIFIER: [name] is a 45 y.o., male with a past medical history of Prediabetes, and GERD. He has followed with [facility] since 09/11/2020 for consultative assistance with management of his left thyroid nodule .   HISTORICAL SUMMARY:  He has been noted with thyromegaly during physical exam which prompted a thyroid ultrasound demonstrating a 5.1 cm left thyroid nodule, meeting FNA criteria. Pt is S/P FNA with benign cytology 09/20/2020   He has hx of esophageal dilatation  Mother and brother with thyroid disease    SUBJECTIVE:     Today (01/02/2021):  [name] is here for a follow up on left thyroid nodule.     He has been noted with weight gain  Denies local neck symptoms but he is aware of his nodule more   Denies diarrhea or loose stools  Denies palpitations  Denies local neck symptoms     HISTORY:  Past Medical History:  Past Medical History:  Diagnosis Date   Enlarged prostate    Followed by Dr. Achilles Pace   GERD (gastroesophageal reflux disease)    Past Surgical History:  Past Surgical History:  Procedure Laterality Date   CARPAL TUNNEL RELEASE Right    [facility]   COLONOSCOPY WITH PROPOFOL N/A 12/14/2020   Procedure: COLONOSCOPY WITH PROPOFOL;  Surgeon: Gary Reil, MD;  Location: [facility];  Service: Endoscopy;  Laterality: N/A;   TONSILLECTOMY AND ADENOIDECTOMY  2008   Social History:  reports that he has never smoked. He has never used smokeless tobacco. He reports that he does not drink alcohol and does not use drugs. Family History:  Family History  Problem Relation Age of Onset   Thyroid disease Brother    Cancer Paternal Grandfather 33       prostate cancer   Stroke  Paternal Grandfather    Diabetes Paternal Grandfather    Other Paternal Grandfather    Heart attack Paternal Grandfather    Cancer Paternal Aunt        breast, brain   Other Mother        unknown medical history   Other Father        unknown medical history   AAA (abdominal aortic aneurysm) Paternal Aunt    Thyroid cancer Neg Hx      HOME MEDICATIONS: Allergies as of 01/01/2021   No Known Allergies      Medication List        Accurate as of January 01, 2021 11:59 PM. If you have any questions, ask your nurse or doctor.          ipratropium 0.06 % nasal spray Commonly known as: ATROVENT Place 2 sprays into both nostrils 4 (four) times daily.   pantoprazole 20 MG tablet Commonly known as: PROTONIX TAKE 1 TABLET(20 MG) BY MOUTH DAILY          OBJECTIVE:   PHYSICAL EXAM: VS: BP 110/82 (BP Location: Left Arm, Patient Position: Sitting, Cuff Size: Large)   Pulse 60   Ht 5\' 6"  (1.676 m)   Wt 237 lb (107.5 kg)   SpO2 98%   BMI 38.25 kg/m    EXAM: General: Pt appears well and is in NAD  Neck: General: Supple without adenopathy. Thyroid: Left thyroid nodule appreciated   Lungs: Clear with good BS bilat with no rales, rhonchi, or wheezes  Heart: Auscultation: RRR.  Abdomen: Normoactive bowel sounds, soft, nontender, without masses or organomegaly palpable  Extremities:  BL LE: No pretibial edema normal ROM and strength.  Mental Status: Judgment, insight: Intact Orientation: Oriented to time, place, and person Mood and affect: No depression, anxiety, or agitation     DATA REVIEWED: Results for CHAD, DONOGHUE" (MRN [id number]) as of 01/02/2021 09:00  Ref. Range 06/01/2020 09:12  TSH Latest Ref Range: 0.35 - 4.50 uIU/mL 2.11    Thyroid Ultrasound 08/18/2020  Nodule # 1:   Location: Left; mid   Maximum size: 5.1 cm; Other 2 dimensions: 3.6 x 3.3 cm   Composition: solid/almost completely solid (2)   Echogenicity: isoechoic (1)   Shape:  taller-than-wide (3)   Margins: ill-defined (0)   Echogenic foci: punctate echogenic foci (3)   ACR TI-RADS total points: 9.   ACR TI-RADS risk category: TR5 (>/= 7 points).   ACR TI-RADS recommendations:   **Given size (>/= 1.0 cm) and appearance, fine needle aspiration of this highly suspicious nodule should be considered based on TI-RADS criteria.   _________________________________________________________   IMPRESSION: TI-RADS 5 left thyroid nodule meets criteria for FNA.    FNA left thyroid nodule 09/20/2020  A. THYROID, LEFT; ULTRASOUND-GUIDED FINE NEEDLE ASPIRATION:  - BENIGN (BETHESDA DIAGNOSTIC CATEGORY II).  - BENIGN FOLLICULAR EPITHELIAL CELLS AND COLLOID.   ASSESSMENT / PLAN / RECOMMENDATIONS:   Left thyroid Nodule :   - No local neck symptoms  - S/P benign FNA of the left thyroid nodule 09/2020 - Given size of thyroid  nodule > 4 cm , I have recommended left lobectomy . Pt  would like to discuss this with the surgeon, a referral has been placed      F/U in 6 months   Signed electronically by: Gary Herrlich, MD  [facility]  [facility] [address] [place] [place], [place] [address] Phone: [phone] FAX: [phone]      CC: Gary Grana, FNP [address] [place] [address] Phone: [phone]  Fax: [phone]   Return to Endocrinology clinic as below: Future Appointments  Date Time Provider Department Center  05/03/2021  9:30 AM [facility] None  05/09/2021  1:00 PM Gary Come, MD [facility] None  07/09/2021  3:00 PM Gary Pace, Gary Dolores, MD [facility] None

## 2021-01-02 DIAGNOSIS — E041 Nontoxic single thyroid nodule: Secondary | ICD-10-CM | POA: Insufficient documentation

## 2021-01-17 ENCOUNTER — Ambulatory Visit: Payer: BC Managed Care – PPO | Admitting: Gastroenterology

## 2021-01-17 ENCOUNTER — Telehealth: Payer: Self-pay | Admitting: Internal Medicine

## 2021-01-17 NOTE — Telephone Encounter (Signed)
Wife called needed referral from Dr. Lonzo Cloud to surgeon on the PT. Please call and provide update.

## 2021-01-17 NOTE — Telephone Encounter (Signed)
Mychart message sent to patient with [facility] information for referral

## 2021-02-02 ENCOUNTER — Encounter: Payer: Self-pay | Admitting: Gastroenterology

## 2021-02-02 ENCOUNTER — Other Ambulatory Visit: Payer: Self-pay

## 2021-02-02 ENCOUNTER — Ambulatory Visit (INDEPENDENT_AMBULATORY_CARE_PROVIDER_SITE_OTHER): Payer: BC Managed Care – PPO | Admitting: Gastroenterology

## 2021-02-02 VITALS — BP 143/80 | HR 49 | Temp 97.3°F | Ht 66.0 in | Wt 233.6 lb

## 2021-02-02 DIAGNOSIS — K640 First degree hemorrhoids: Secondary | ICD-10-CM | POA: Diagnosis not present

## 2021-02-02 DIAGNOSIS — K625 Hemorrhage of anus and rectum: Secondary | ICD-10-CM | POA: Diagnosis not present

## 2021-02-02 NOTE — Progress Notes (Signed)
PROCEDURE NOTE: The patient presents with symptomatic grade 1 hemorrhoids, unresponsive to maximal medical therapy, requesting rubber band ligation of his/her hemorrhoidal disease.  All risks, benefits and alternative forms of therapy were described and informed consent was obtained.  The decision was made to band the RP internal hemorrhoid, and the [facility] O'Regan System was used to perform band ligation without complication.  Digital anorectal examination was then performed to assure proper positioning of the band, and to adjust the banded tissue as required.  The patient was discharged home without pain or other issues.  Dietary and behavioral recommendations were given and (if necessary - prescriptions were given), along with follow-up instructions.  The patient will return as needed  for follow-up and possible additional banding as required.  No complications were encountered and the patient tolerated the procedure well.   

## 2021-02-02 NOTE — Progress Notes (Signed)
Arlyss Repress, MD [address]  [address]  [place], [place] [address]  Main: [phone]  Fax: [phone]    Gastroenterology Consultation  Referring Provider:     Allegra Grana, FNP Primary Care Physician:  Allegra Grana, FNP Primary Gastroenterologist:  Dr. Arlyss Repress Reason for Consultation:     Rectal bleeding        HPI:   Gary Pace is a 45 y.o. male referred by Dr. Allegra Grana, FNP  for consultation & management of rectal bleeding.  Patient reports that he has been experiencing intermittent bright red blood per rectum on wiping.  He notices a tissue in the perianal area that intermittently gets slightly tender when it bleeds.  It happens about twice a month.  He underwent colonoscopy which revealed small external hemorrhoids only.  He denies any other hemorrhoidal symptoms.  He denies constipation or diarrhea, sometimes experiences hard stool in the beginning of his BM.   Patient does not smoke or drink alcohol  NSAIDs: None  Antiplts/Anticoagulants/Anti thrombotics: None  GI Procedures:  Colonoscopy screening 12/14/2020 - The entire examined colon is normal. - Non-bleeding external hemorrhoids. - No specimens collected.  Past Medical History:  Diagnosis Date   Enlarged prostate    Followed by Dr. Achilles Dunk   GERD (gastroesophageal reflux disease)     Past Surgical History:  Procedure Laterality Date   CARPAL TUNNEL RELEASE Right    [facility]   COLONOSCOPY WITH PROPOFOL N/A 12/14/2020   Procedure: COLONOSCOPY WITH PROPOFOL;  Surgeon: Toney Reil, MD;  Location: [facility];  Service: Endoscopy;  Laterality: N/A;   TONSILLECTOMY AND ADENOIDECTOMY  2008    Current Outpatient Medications:    ipratropium (ATROVENT) 0.06 % nasal spray, Place 2 sprays into both nostrils 4 (four) times daily., Disp: 15 mL, Rfl: 12   pantoprazole (PROTONIX) 20 MG tablet, TAKE 1 TABLET(20 MG) BY MOUTH DAILY, Disp: 90 tablet, Rfl:  1   Family History  Problem Relation Age of Onset   Thyroid disease Brother    Cancer Paternal Grandfather 58       prostate cancer   Stroke Paternal Grandfather    Diabetes Paternal Grandfather    Other Paternal Grandfather    Heart attack Paternal Grandfather    Cancer Paternal Aunt        breast, brain   Other Mother        unknown medical history   Other Father        unknown medical history   AAA (abdominal aortic aneurysm) Paternal Aunt    Thyroid cancer Neg Hx      Social History   Tobacco Use   Smoking status: Never   Smokeless tobacco: Never  Vaping Use   Vaping Use: Never used  Substance Use Topics   Alcohol use: No    Alcohol/week: 0.0 standard drinks   Drug use: No    Allergies as of 02/02/2021   (No Known Allergies)    Review of Systems:    All systems reviewed and negative except where noted in HPI.   Physical Exam:  BP (!) 143/80 (BP Location: Left Arm, Patient Position: Sitting, Cuff Size: Large)    Pulse (!) 49    Temp (!) 97.3 F (36.3 C) (Temporal)    Ht 5\' 6"  (1.676 m)    Wt 233 lb 9.6 oz (106 kg)    BMI 37.70 kg/m  No LMP for male patient.  General:  Alert,  Well-developed, well-nourished, pleasant and cooperative in NAD Head:  Normocephalic and atraumatic. Eyes:  Sclera clear, no icterus.   Conjunctiva pink. Ears:  Normal auditory acuity. Nose:  No deformity, discharge, or lesions. Mouth:  No deformity or lesions,oropharynx pink & moist. Neck:  Supple; no masses or thyromegaly. Lungs:  Respirations even and unlabored.  Clear throughout to auscultation.   No wheezes, crackles, or rhonchi. No acute distress. Heart:  Regular rate and rhythm; no murmurs, clicks, rubs, or gallops. Abdomen:  Normal bowel sounds. Soft, non-tender and non-distended without masses, hepatosplenomegaly or hernias noted.  No guarding or rebound tenderness.   Rectal: Small perianal non mobile skin tag is located in the right posterior area.  Nontender digital rectal  exam Msk:  Symmetrical without gross deformities. Good, equal movement & strength bilaterally. Pulses:  Normal pulses noted. Extremities:  No clubbing or edema.  No cyanosis. Neurologic:  Alert and oriented x3;  grossly normal neurologically. Skin:  Intact without significant lesions or rashes. No jaundice. Psych:  Alert and cooperative. Normal mood and affect.  Imaging Studies: None  Assessment and Plan:   Gary Pace is a 45 y.o. pleasant male with no significant past medical history seen in consultation for chronic history of intermittent rectal bleeding secondary to grade 1 symptomatic external hemorrhoids  Testing about outpatient hemorrhoid ligation, the procedure and risks and benefits Consent obtained Proceed with hemorrhoid ligation today   Follow up as needed   Arlyss Repress, MD

## 2021-02-22 ENCOUNTER — Ambulatory Visit: Payer: Self-pay | Admitting: Surgery

## 2021-02-22 DIAGNOSIS — E041 Nontoxic single thyroid nodule: Secondary | ICD-10-CM | POA: Diagnosis not present

## 2021-03-04 ENCOUNTER — Encounter (HOSPITAL_COMMUNITY): Payer: Self-pay | Admitting: Surgery

## 2021-03-04 NOTE — H&P (Signed)
REFERRING PHYSICIAN: Shamleffer, Clemmie Krill*  PROVIDER: Kasheena Sambrano Myra Rude, MD  Chief Complaint: New Consultation (Left thyroid nodule)   History of Present Illness:  Patient is referred by Dr. Lesly Dukes for surgical evaluation and management of newly diagnosed thyroid nodule. Patient had noted a mass in the left anterior neck on self-examination. He also noted globus sensation and complained of dysphagia. The patient did undergo an upper endoscopy with dilatation resulting in improvement of his dysphagia. Patient underwent an ultrasound examination was on August 18, 2020. This demonstrated an enlarged left thyroid lobe containing a solitary nodule measuring 5.1 x 3.6 x 3.3 cm. By ultrasound criteria, this was felt to be highly suspicious and biopsy was recommended. Fine-needle aspiration biopsy was performed on September 20, 2020. This showed benign follicular epithelial cells and colloid consistent with a benign nodule, Bethesda category II. Patient has no prior history of thyroid disease. He has never been on thyroid medication. Recent TSH level was normal at 2.11. Patient has had no prior head or neck surgery with the exception of tonsillectomy. Patient does have a family history of thyroid disease in his brother who recently required thyroidectomy. Patient does not know the reason why the brother required surgery. Patient presents today accompanied by his wife.  Review of Systems: A complete review of systems was obtained from the patient. I have reviewed this information and discussed as appropriate with the patient. See HPI as well for other ROS.  Review of Systems  Constitutional: Negative.  HENT:  Globus sensation, dysphagia  Eyes: Negative.  Respiratory: Negative.  Cardiovascular: Negative.  Gastrointestinal: Negative.  Genitourinary: Negative.  Musculoskeletal: Negative.  Skin: Negative.  Neurological: Negative.  Endo/Heme/Allergies: Negative.  Psychiatric/Behavioral:  Negative.    Medical History: Past Medical History:  Diagnosis Date   Bilateral carpal tunnel syndrome   GERD (gastroesophageal reflux disease)   History of myocardial infarction   Sleep apnea   Patient Active Problem List  Diagnosis   Seasonal allergies   Chronic prostatitis   Elevated blood pressure reading   Benign localized hyperplasia of prostate with urinary obstruction   Thyroid nodule   Past Surgical History:  Procedure Laterality Date   EGD 08/07/2018  Eosinophilic Esophagitis/No Repeat/TKT   CARPAL TUNNEL RELEASE  Right   TONSILLECTOMY   UPPER GASTROINTESTINAL ENDOSCOPY    No Known Allergies  Current Outpatient Medications on File Prior to Visit  Medication Sig Dispense Refill   pantoprazole (PROTONIX) 20 MG DR tablet Take 1 tablet (20 mg total) by mouth once daily 30 tablet 6   No current facility-administered medications on file prior to visit.   Family History  Problem Relation Age of Onset   Coronary Artery Disease (Blocked arteries around heart) Paternal Grandmother   Hyperlipidemia (Elevated cholesterol) Paternal Grandmother   High blood pressure (Hypertension) Paternal Grandmother   Cancer Paternal Grandfather   Diabetes Paternal Grandfather   Liver disease Paternal Grandfather   Thyroid disease Mother    Social History   Tobacco Use  Smoking Status Never  Smokeless Tobacco Never    Social History   Socioeconomic History   Marital status: Single  Tobacco Use   Smoking status: Never   Smokeless tobacco: Never  Substance and Sexual Activity   Alcohol use: No   Drug use: No   Sexual activity: Yes  Social History Narrative  Education: na  Occupation: na  Hobbies: na   Objective:   Vitals:  BP: 120/76  Pulse: 59  Temp: 36.6 C (97.8  F)  SpO2: 100%  Weight: (!) 106.4 kg (234 lb 9.6 oz)  Height: 167.6 cm (5\' 6" )   Body mass index is 37.87 kg/m.  Physical Exam   GENERAL APPEARANCE Development: normal Nutritional status:  normal Gross deformities: none  SKIN Rash, lesions, ulcers: none Induration, erythema: none Nodules: none palpable  EYES Conjunctiva and lids: normal Pupils: equal and reactive Iris: normal bilaterally  EARS, NOSE, MOUTH, THROAT External ears: no lesion or deformity External nose: no lesion or deformity Hearing: grossly normal Due to Covid-19 pandemic, patient is wearing a mask.  NECK Symmetric: no Trachea: midline Thyroid: Right thyroid lobe is normal palpation. There is a dominant mass in the anterior left thyroid lobe extending into the isthmus. This is smooth, firm, and nontender. There is no associated lymphadenopathy.  CHEST Respiratory effort: normal Retraction or accessory muscle use: no Breath sounds: normal bilaterally Rales, rhonchi, wheeze: none  CARDIOVASCULAR Auscultation: regular rhythm, normal rate Murmurs: none Pulses: radial pulse 2+ palpable Lower extremity edema: none  MUSCULOSKELETAL Station and gait: normal Digits and nails: no clubbing or cyanosis Muscle strength: grossly normal all extremities Range of motion: grossly normal all extremities Deformity: none  LYMPHATIC Cervical: none palpable Supraclavicular: none palpable  PSYCHIATRIC Oriented to person, place, and time: yes Mood and affect: normal for situation Judgment and insight: appropriate for situation  Assessment and Plan:   Thyroid nodule   Patient is referred by his endocrinologist, Dr. , for surgical evaluation and management of a dominant left thyroid nodule. Ultrasound criteria are quite worrisome for possibly malignancy, but fine-needle aspiration biopsy showed benign cytopathology. Patient does have a persistent compressive symptoms. Therefore his endocrinologist has recommended proceeding with left thyroid lobectomy. I am in agreement.  Patient provided with a copy of "The Thyroid Book: Medical and Surgical Treatment of Thyroid Problems", published by  [employer], 16 pages. Book reviewed and explained to patient during visit today.  Today we discussed the procedure of left thyroid lobectomy. We discussed the risk and benefits including the risk of recurrent laryngeal nerve injury and injury to parathyroid glands. We discussed the size and location of the surgical incision. We discussed the hospital stay to be anticipated. We discussed his recovery and return to workout activities. We discussed the potential need for lifelong thyroid hormone replacement. We discussed the potential need for additional thyroid surgery. We discussed the potential need for radioactive iodine treatment. The patient and his wife understand and wish to proceed with surgery in the near future.  The risks and benefits of the procedure have been discussed at length with the patient. The patient understands the proposed procedure, potential alternative treatments, and the course of recovery to be expected. All of the patient's questions have been answered at this time. The patient wishes to proceed with surgery.   Lesly Dukes, MD [facility] practice Office: [phone]

## 2021-03-06 ENCOUNTER — Encounter (HOSPITAL_COMMUNITY): Payer: Self-pay

## 2021-03-06 NOTE — Patient Instructions (Addendum)
DUE TO COVID-19 ONLY ONE VISITOR IS ALLOWED TO COME WITH YOU AND STAY IN THE WAITING ROOM ONLY DURING PRE OP AND PROCEDURE.   **NO VISITORS ARE ALLOWED IN THE SHORT STAY AREA OR RECOVERY ROOM!!**  IF YOU WILL BE ADMITTED INTO THE HOSPITAL YOU ARE ALLOWED ONLY TWO SUPPORT PEOPLE DURING VISITATION HOURS ONLY (7 AM -8PM)    Up to two visitors ages 9+ are allowed at one time in a patient's room.  The visitors may rotate out with other people throughout the day.  Additionally, up to two children between the ages of 52 and 20 are allowed and do not count toward the number of allowed visitors.  Children within this age range must be accompanied by an adult visitor.  One adult visitor may remain with the patient overnight and must be in the room by 8 PM.  COVID SWAB TESTING MUST BE COMPLETED ON: 03-08-21 @ 9:00 AM    COME IN THROUGH MAIN ENTRANCE of [facility].  Take a seat in the lobby area to the right as you come in the main entrance.  Call [phone]  and give your name and let them know you are here for COVID testing  You are not required to quarantine, however you are required to wear a well-fitted mask when you are out and around people not in your household.  Hand Hygiene often Do NOT share personal items Notify your provider if you are in close contact with someone who has COVID or you develop fever 100.4 or greater, new onset of sneezing, cough, sore throat, shortness of breath or body aches.       Your procedure is scheduled on: Friday, 03-09-21   Report to [facility] Main  Entrance     Report to admitting at 8:45 AM   Call this number if you have problems the morning of surgery [phone]   Do not eat food :After Midnight.   May have liquids until 8:00 AM day of surgery  CLEAR LIQUID DIET  Foods Allowed                                                                     Foods Excluded  Water, Black Coffee (no milk/no creamer) and tea, regular and decaf                               liquids that you cannot  Plain Jell-O in any flavor  (No red)                         see through such as: Fruit ices (not with fruit pulp)                                 milk, soups, orange juice  Iced Popsicles (No red)                                    All solid food  Apple juices Sports drinks like Gatorade (No red) Lightly seasoned clear broth or consume(fat free) Sugar      Oral Hygiene is also important to reduce your risk of infection.                                    Remember - BRUSH YOUR TEETH THE MORNING OF SURGERY WITH YOUR REGULAR TOOTHPASTE   Do NOT smoke after Midnight   Take these medicines the morning of surgery with A SIP OF WATER: Pantoprazole   Stop all vitamins and herbal supplements a week before surgery             You may not have any metal on your body including  jewelry, and body piercing             Do not wear lotions, powders,cologne, or deodorant              Men may shave face and neck.  Do not bring valuables to the hospital. [facility] IS NOT RESPONSIBLE FOR VALUABLES.   Contacts, dentures or bridgework may not be worn into surgery.   Bring small overnight bag day of surgery.   Please read over the following fact sheets you were given: IF YOU HAVE QUESTIONS ABOUT YOUR PRE OP INSTRUCTIONS PLEASE CALL [phone] [facility] - Preparing for Surgery Before surgery, you can play an important role.  Because skin is not sterile, your skin needs to be as free of germs as possible.  You can reduce the number of germs on your skin by washing with CHG (chlorahexidine gluconate) soap before surgery.  CHG is an antiseptic cleaner which kills germs and bonds with the skin to continue killing germs even after washing. Please DO NOT use if you have an allergy to CHG or antibacterial soaps.  If your skin becomes reddened/irritated stop using the CHG and inform your nurse when you arrive at Short Stay. Do not  shave (including legs and underarms) for at least 48 hours prior to the first CHG shower.  You may shave your face/neck.  Please follow these instructions carefully:  1.  Shower with CHG Soap the night before surgery and the  morning of surgery.  2.  If you choose to wash your hair, wash your hair first as usual with your normal  shampoo.  3.  After you shampoo, rinse your hair and body thoroughly to remove the shampoo.                             4.  Use CHG as you would any other liquid soap.  You can apply chg directly to the skin and wash.  Gently with a scrungie or clean washcloth.  5.  Apply the CHG Soap to your body ONLY FROM THE NECK DOWN.   Do   not use on face/ open                           Wound or open sores. Avoid contact with eyes, ears mouth and   genitals (private parts).                       Wash face,  Genitals (private parts) with your normal soap.  6.  Wash thoroughly, paying special attention to the area where your    surgery  will be performed.  7.  Thoroughly rinse your body with warm water from the neck down.  8.  DO NOT shower/wash with your normal soap after using and rinsing off the CHG Soap.                9.  Pat yourself dry with a clean towel.            10.  Wear clean pajamas.            11.  Place clean sheets on your bed the night of your first shower and do not  sleep with pets. Day of Surgery : Do not apply any lotions/deodorants the morning of surgery.  Please wear clean clothes to the hospital/surgery center.  FAILURE TO FOLLOW THESE INSTRUCTIONS MAY RESULT IN THE CANCELLATION OF YOUR SURGERY  PATIENT SIGNATURE_________________________________  NURSE SIGNATURE__________________________________  ________________________________________________________________________

## 2021-03-06 NOTE — Progress Notes (Addendum)
COVID swab appointment: 03-08-21 @ 9:00 AM  COVID Vaccine Completed:  No Date COVID Vaccine completed: Has received booster: COVID vaccine manufacturer: Pfizer    [employer]   Date of COVID positive in last 90 days:  No  PCP - Rennie Plowman, FNP Cardiologist - Debbe Odea, MD  Chest x-ray - 03-08-21 Epic EKG - 03-08-21 Epic Stress Test - N/A ECHO - 07-08-19 Epic Cardiac Cath - N/A Pacemaker/ICD device last checked: Spinal Cord Stimulator:  Sleep Study - Yes, Neg sleep apnea CPAP - No  Fasting Blood Sugar - N/A Checks Blood Sugar _____ times a day  Blood Thinner Instructions: Aspirin Instructions:N/A Last Dose:  Activity level:   Can go up a flight of stairs and perform activities of daily living without stopping and without symptoms of chest pain or shortness of breath.  Able to exercise without symptoms  Anesthesia review:  SOB post Covid evaluated by cardiology, cardiomegaly  Shortness of breath has resolved  Patient denies shortness of breath, fever, cough and chest pain at PAT appointment  Patient verbalized understanding of instructions that were given to them at the PAT appointment. Patient was also instructed that they will need to review over the PAT instructions again at home before surgery.

## 2021-03-08 ENCOUNTER — Encounter (HOSPITAL_COMMUNITY): Payer: Self-pay

## 2021-03-08 ENCOUNTER — Other Ambulatory Visit: Payer: Self-pay

## 2021-03-08 ENCOUNTER — Encounter (HOSPITAL_COMMUNITY)
Admission: RE | Admit: 2021-03-08 | Discharge: 2021-03-08 | Disposition: A | Discharge status: Home / Self Care | Payer: BC Managed Care – PPO | Source: Ambulatory Visit | Attending: Surgery | Admitting: Surgery

## 2021-03-08 ENCOUNTER — Ambulatory Visit (HOSPITAL_COMMUNITY)
Admission: RE | Admit: 2021-03-08 | Discharge: 2021-03-08 | Disposition: A | Discharge status: Home / Self Care | Payer: BC Managed Care – PPO | Source: Ambulatory Visit | Attending: Anesthesiology | Admitting: Anesthesiology

## 2021-03-08 VITALS — BP 163/71 | HR 51 | Temp 98.2°F | Resp 18 | Ht 66.0 in | Wt 236.4 lb

## 2021-03-08 DIAGNOSIS — I2510 Atherosclerotic heart disease of native coronary artery without angina pectoris: Secondary | ICD-10-CM | POA: Diagnosis not present

## 2021-03-08 DIAGNOSIS — Z01818 Encounter for other preprocedural examination: Secondary | ICD-10-CM | POA: Insufficient documentation

## 2021-03-08 DIAGNOSIS — Z20822 Contact with and (suspected) exposure to covid-19: Secondary | ICD-10-CM | POA: Insufficient documentation

## 2021-03-08 DIAGNOSIS — E041 Nontoxic single thyroid nodule: Secondary | ICD-10-CM | POA: Diagnosis not present

## 2021-03-08 DIAGNOSIS — Z8349 Family history of other endocrine, nutritional and metabolic diseases: Secondary | ICD-10-CM | POA: Diagnosis not present

## 2021-03-08 DIAGNOSIS — R1310 Dysphagia, unspecified: Secondary | ICD-10-CM | POA: Diagnosis not present

## 2021-03-08 DIAGNOSIS — E042 Nontoxic multinodular goiter: Secondary | ICD-10-CM | POA: Diagnosis not present

## 2021-03-08 HISTORY — DX: Cardiomyopathy, unspecified: I42.9

## 2021-03-08 HISTORY — DX: Dyspnea, unspecified: R06.00

## 2021-03-08 LAB — BASIC METABOLIC PANEL
Anion gap: 5 (ref 5–15)
BUN: 12 mg/dL (ref 6–20)
CO2: 26 mmol/L (ref 22–32)
Calcium: 8.9 mg/dL (ref 8.9–10.3)
Chloride: 108 mmol/L (ref 98–111)
Creatinine, Ser: 0.93 mg/dL (ref 0.61–1.24)
GFR, Estimated: >60 mL/min (ref >60)
Glucose, Bld: 101 mg/dL — ABNORMAL HIGH (ref 70–99)
Potassium: 3.9 mmol/L (ref 3.5–5.1)
Sodium: 139 mmol/L (ref 135–145)

## 2021-03-08 LAB — CBC
HCT: 43.4 % (ref 39.0–52.0)
Hemoglobin: 14.4 g/dL (ref 13.0–17.0)
MCH: 28.6 pg (ref 26.0–34.0)
MCHC: 33.2 g/dL (ref 30.0–36.0)
MCV: 86.3 fL (ref 80.0–100.0)
Platelets: 266 10*3/uL (ref 150–400)
RBC: 5.03 MIL/uL (ref 4.22–5.81)
RDW: 12.3 % (ref 11.5–15.5)
WBC: 8.1 10*3/uL (ref 4.0–10.5)
nRBC: 0 % (ref 0.0–0.2)

## 2021-03-08 LAB — SARS CORONAVIRUS 2 (TAT 6-24 HRS): SARS Coronavirus 2: NEGATIVE

## 2021-03-09 ENCOUNTER — Other Ambulatory Visit: Payer: Self-pay

## 2021-03-09 ENCOUNTER — Ambulatory Visit (HOSPITAL_COMMUNITY): Payer: BC Managed Care – PPO | Admitting: Physician Assistant

## 2021-03-09 ENCOUNTER — Encounter (HOSPITAL_COMMUNITY): Payer: Self-pay | Admitting: Surgery

## 2021-03-09 ENCOUNTER — Ambulatory Visit (HOSPITAL_COMMUNITY)
Admission: RE | Admit: 2021-03-09 | Discharge: 2021-03-10 | Disposition: A | Discharge status: Home / Self Care | Payer: BC Managed Care – PPO | Attending: Surgery | Admitting: Surgery

## 2021-03-09 ENCOUNTER — Encounter (HOSPITAL_COMMUNITY)
Admission: RE | Disposition: A | Discharge status: Home / Self Care | Payer: Self-pay | Source: Home / Self Care | Attending: Surgery

## 2021-03-09 ENCOUNTER — Ambulatory Visit (HOSPITAL_COMMUNITY): Payer: BC Managed Care – PPO | Admitting: Certified Registered Nurse Anesthetist

## 2021-03-09 DIAGNOSIS — R1310 Dysphagia, unspecified: Secondary | ICD-10-CM | POA: Diagnosis not present

## 2021-03-09 DIAGNOSIS — E042 Nontoxic multinodular goiter: Secondary | ICD-10-CM | POA: Diagnosis not present

## 2021-03-09 DIAGNOSIS — Z8349 Family history of other endocrine, nutritional and metabolic diseases: Secondary | ICD-10-CM | POA: Insufficient documentation

## 2021-03-09 DIAGNOSIS — E041 Nontoxic single thyroid nodule: Secondary | ICD-10-CM

## 2021-03-09 HISTORY — PX: THYROID LOBECTOMY: SHX420

## 2021-03-09 SURGERY — LOBECTOMY, THYROID
Anesthesia: General | Site: Neck | Laterality: Left

## 2021-03-09 MED ORDER — CEFAZOLIN SODIUM-DEXTROSE 2-4 GM/100ML-% IV SOLN
INTRAVENOUS | Status: AC
  Filled 2021-03-09: qty 100

## 2021-03-09 MED ORDER — OXYCODONE HCL 5 MG/5ML PO SOLN: 5.0000 mg | Freq: Once | ORAL | Status: AC | PRN

## 2021-03-09 MED ORDER — LIDOCAINE 2% (20 MG/ML) 5 ML SYRINGE
INTRAMUSCULAR | Status: DC | PRN
  Administered 2021-03-09: 100 mg via INTRAVENOUS

## 2021-03-09 MED ORDER — ONDANSETRON HCL 4 MG/2ML IJ SOLN: 4.0000 mg | Freq: Once | INTRAMUSCULAR | Status: DC | PRN

## 2021-03-09 MED ORDER — FENTANYL CITRATE (PF) 100 MCG/2ML IJ SOLN
INTRAMUSCULAR | Status: AC
  Filled 2021-03-09: qty 2

## 2021-03-09 MED ORDER — ACETAMINOPHEN 10 MG/ML IV SOLN: 1000.0000 mg | Freq: Once | INTRAVENOUS | Status: DC | PRN

## 2021-03-09 MED ORDER — DEXAMETHASONE SODIUM PHOSPHATE 10 MG/ML IJ SOLN
INTRAMUSCULAR | Status: AC
  Filled 2021-03-09: qty 1

## 2021-03-09 MED ORDER — PHENYLEPHRINE 40 MCG/ML (10ML) SYRINGE FOR IV PUSH (FOR BLOOD PRESSURE SUPPORT)
PREFILLED_SYRINGE | INTRAVENOUS | Status: AC
  Filled 2021-03-09: qty 20

## 2021-03-09 MED ORDER — OXYCODONE HCL 5 MG PO TABS
ORAL_TABLET | ORAL | Status: AC
  Filled 2021-03-09: qty 1

## 2021-03-09 MED ORDER — FENTANYL CITRATE PF 50 MCG/ML IJ SOSY
25.0000 ug | PREFILLED_SYRINGE | INTRAMUSCULAR | Status: DC | PRN
  Administered 2021-03-09: 25 ug via INTRAVENOUS
  Administered 2021-03-09: 50 ug via INTRAVENOUS

## 2021-03-09 MED ORDER — OXYCODONE HCL 5 MG PO TABS
5.0000 mg | ORAL_TABLET | ORAL | Status: DC | PRN
  Administered 2021-03-09: 10 mg via ORAL
  Filled 2021-03-09 (×2): qty 2

## 2021-03-09 MED ORDER — ONDANSETRON HCL 4 MG/2ML IJ SOLN: 4.0000 mg | Freq: Four times a day (QID) | INTRAMUSCULAR | Status: DC | PRN

## 2021-03-09 MED ORDER — FENTANYL CITRATE PF 50 MCG/ML IJ SOSY
PREFILLED_SYRINGE | INTRAMUSCULAR | Status: AC
  Filled 2021-03-09: qty 1

## 2021-03-09 MED ORDER — CHLORHEXIDINE GLUCONATE 0.12 % MT SOLN
15.0000 mL | Freq: Once | OROMUCOSAL | Status: AC
  Administered 2021-03-09: 15 mL via OROMUCOSAL

## 2021-03-09 MED ORDER — ACETAMINOPHEN 650 MG RE SUPP: 650.0000 mg | Freq: Four times a day (QID) | RECTAL | Status: DC | PRN

## 2021-03-09 MED ORDER — OXYCODONE HCL 5 MG PO TABS
5.0000 mg | ORAL_TABLET | Freq: Once | ORAL | Status: AC | PRN
  Administered 2021-03-09: 5 mg via ORAL

## 2021-03-09 MED ORDER — TRAMADOL HCL 50 MG PO TABS
50.0000 mg | ORAL_TABLET | Freq: Four times a day (QID) | ORAL | 0 refills | Status: DC | PRN
Start: 2021-03-09 — End: 2021-05-09

## 2021-03-09 MED ORDER — LIDOCAINE HCL 2 % IJ SOLN
INTRAMUSCULAR | Status: AC
  Filled 2021-03-09: qty 20

## 2021-03-09 MED ORDER — TRAMADOL HCL 50 MG PO TABS
50.0000 mg | ORAL_TABLET | Freq: Four times a day (QID) | ORAL | Status: DC | PRN
  Administered 2021-03-09: 50 mg via ORAL
  Filled 2021-03-09: qty 1

## 2021-03-09 MED ORDER — MEPERIDINE HCL 50 MG/ML IJ SOLN: 6.2500 mg | INTRAMUSCULAR | Status: DC | PRN

## 2021-03-09 MED ORDER — FENTANYL CITRATE PF 50 MCG/ML IJ SOSY
PREFILLED_SYRINGE | INTRAMUSCULAR | Status: AC
  Administered 2021-03-09: 25 ug via INTRAVENOUS
  Filled 2021-03-09: qty 1

## 2021-03-09 MED ORDER — MIDAZOLAM HCL 2 MG/2ML IJ SOLN
INTRAMUSCULAR | Status: AC
  Filled 2021-03-09: qty 2

## 2021-03-09 MED ORDER — DEXMEDETOMIDINE (PRECEDEX) IN NS 20 MCG/5ML (4 MCG/ML) IV SYRINGE
PREFILLED_SYRINGE | INTRAVENOUS | Status: DC | PRN
  Administered 2021-03-09: 12 ug via INTRAVENOUS
  Administered 2021-03-09: 8 ug via INTRAVENOUS

## 2021-03-09 MED ORDER — ONDANSETRON HCL 4 MG/2ML IJ SOLN
INTRAMUSCULAR | Status: DC | PRN
  Administered 2021-03-09: 4 mg via INTRAVENOUS

## 2021-03-09 MED ORDER — 0.9 % SODIUM CHLORIDE (POUR BTL) OPTIME
TOPICAL | Status: DC | PRN
  Administered 2021-03-09: 1000 mL

## 2021-03-09 MED ORDER — HEMOSTATIC AGENTS (NO CHARGE) OPTIME
TOPICAL | Status: DC | PRN
  Administered 2021-03-09: 1

## 2021-03-09 MED ORDER — PROPOFOL 10 MG/ML IV BOLUS
INTRAVENOUS | Status: AC
  Filled 2021-03-09: qty 20

## 2021-03-09 MED ORDER — LACTATED RINGERS IV SOLN: INTRAVENOUS | Status: DC

## 2021-03-09 MED ORDER — IBUPROFEN 400 MG PO TABS
600.0000 mg | ORAL_TABLET | Freq: Four times a day (QID) | ORAL | Status: DC | PRN
  Administered 2021-03-09 – 2021-03-10 (×3): 600 mg via ORAL
  Filled 2021-03-09 (×4): qty 1

## 2021-03-09 MED ORDER — MIDAZOLAM HCL 5 MG/5ML IJ SOLN
INTRAMUSCULAR | Status: DC | PRN
  Administered 2021-03-09: 2 mg via INTRAVENOUS

## 2021-03-09 MED ORDER — GLYCOPYRROLATE 0.2 MG/ML IJ SOLN
INTRAMUSCULAR | Status: DC | PRN
  Administered 2021-03-09: .2 mg via INTRAVENOUS

## 2021-03-09 MED ORDER — LIDOCAINE HCL (PF) 2 % IJ SOLN
INTRAMUSCULAR | Status: DC | PRN
  Administered 2021-03-09: 1.5 mg/kg/h via INTRADERMAL

## 2021-03-09 MED ORDER — ROCURONIUM BROMIDE 10 MG/ML (PF) SYRINGE
PREFILLED_SYRINGE | INTRAVENOUS | Status: AC
  Filled 2021-03-09: qty 10

## 2021-03-09 MED ORDER — SUGAMMADEX SODIUM 500 MG/5ML IV SOLN
INTRAVENOUS | Status: DC | PRN
  Administered 2021-03-09: 320 mg via INTRAVENOUS

## 2021-03-09 MED ORDER — CEFAZOLIN SODIUM-DEXTROSE 2-4 GM/100ML-% IV SOLN
2.0000 g | INTRAVENOUS | Status: AC
  Administered 2021-03-09: 2 g via INTRAVENOUS

## 2021-03-09 MED ORDER — FENTANYL CITRATE (PF) 100 MCG/2ML IJ SOLN
INTRAMUSCULAR | Status: DC | PRN
Start: 2021-03-09 — End: 2021-03-09
  Administered 2021-03-09 (×6): 50 ug via INTRAVENOUS

## 2021-03-09 MED ORDER — ONDANSETRON 4 MG PO TBDP: 4.0000 mg | ORAL_TABLET | Freq: Four times a day (QID) | ORAL | Status: DC | PRN

## 2021-03-09 MED ORDER — CHLORHEXIDINE GLUCONATE CLOTH 2 % EX PADS: 6.0000 | MEDICATED_PAD | Freq: Once | CUTANEOUS | Status: DC

## 2021-03-09 MED ORDER — SODIUM CHLORIDE 0.45 % IV SOLN: INTRAVENOUS | Status: DC

## 2021-03-09 MED ORDER — ONDANSETRON HCL 4 MG/2ML IJ SOLN
INTRAMUSCULAR | Status: AC
  Filled 2021-03-09: qty 2

## 2021-03-09 MED ORDER — ROCURONIUM BROMIDE 10 MG/ML (PF) SYRINGE
PREFILLED_SYRINGE | INTRAVENOUS | Status: DC | PRN
  Administered 2021-03-09: 80 mg via INTRAVENOUS

## 2021-03-09 MED ORDER — PROPOFOL 10 MG/ML IV BOLUS
INTRAVENOUS | Status: DC | PRN
  Administered 2021-03-09: 200 mg via INTRAVENOUS

## 2021-03-09 MED ORDER — DEXAMETHASONE SODIUM PHOSPHATE 10 MG/ML IJ SOLN
INTRAMUSCULAR | Status: DC | PRN
  Administered 2021-03-09: 8 mg via INTRAVENOUS

## 2021-03-09 MED ORDER — HYDROMORPHONE HCL 1 MG/ML IJ SOLN: 1.0000 mg | INTRAMUSCULAR | Status: DC | PRN

## 2021-03-09 MED ORDER — ORAL CARE MOUTH RINSE: 15.0000 mL | Freq: Once | OROMUCOSAL | Status: AC

## 2021-03-09 MED ORDER — ACETAMINOPHEN 325 MG PO TABS
650.0000 mg | ORAL_TABLET | Freq: Four times a day (QID) | ORAL | Status: DC | PRN
  Filled 2021-03-09: qty 2

## 2021-03-09 SURGICAL SUPPLY — 31 items
ATTRACTOMAT 16X20 MAGNETIC DRP (DRAPES) ×2 IMPLANT
BAG COUNTER SPONGE SURGICOUNT (BAG) ×2 IMPLANT
BLADE SURG 15 STRL LF DISP TIS (BLADE) ×1 IMPLANT
BLADE SURG 15 STRL SS (BLADE) ×1
CHLORAPREP W/TINT 26 (MISCELLANEOUS) ×2 IMPLANT
CLIP TI MEDIUM 6 (CLIP) ×5 IMPLANT
CLIP TI WIDE RED SMALL 6 (CLIP) ×4 IMPLANT
COVER SURGICAL LIGHT HANDLE (MISCELLANEOUS) ×2 IMPLANT
DERMABOND ADVANCED (GAUZE/BANDAGES/DRESSINGS) ×1
DERMABOND ADVANCED .7 DNX12 (GAUZE/BANDAGES/DRESSINGS) ×1 IMPLANT
DRAPE LAPAROTOMY T 98X78 PEDS (DRAPES) ×2 IMPLANT
DRAPE UTILITY XL STRL (DRAPES) ×2 IMPLANT
ELECT PENCIL ROCKER SW 15FT (MISCELLANEOUS) ×2 IMPLANT
ELECT REM PT RETURN 15FT ADLT (MISCELLANEOUS) ×2 IMPLANT
GAUZE 4X4 16PLY ~~LOC~~+RFID DBL (SPONGE) ×2 IMPLANT
GLOVE SURG SYN 7.5  E (GLOVE) ×2
GLOVE SURG SYN 7.5 E (GLOVE) ×2 IMPLANT
GLOVE SURG SYN 7.5 PF PI (GLOVE) ×2 IMPLANT
GOWN STRL REUS W/TWL XL LVL3 (GOWN DISPOSABLE) ×4 IMPLANT
HEMOSTAT SURGICEL 2X4 FIBR (HEMOSTASIS) ×2 IMPLANT
ILLUMINATOR WAVEGUIDE N/F (MISCELLANEOUS) ×2 IMPLANT
KIT BASIN OR (CUSTOM PROCEDURE TRAY) ×2 IMPLANT
KIT TURNOVER KIT A (KITS) IMPLANT
PACK BASIC VI WITH GOWN DISP (CUSTOM PROCEDURE TRAY) ×2 IMPLANT
SHEARS HARMONIC 9CM CVD (BLADE) ×2 IMPLANT
SUT MNCRL AB 4-0 PS2 18 (SUTURE) ×2 IMPLANT
SUT VIC AB 3-0 SH 18 (SUTURE) ×4 IMPLANT
SYR BULB IRRIG 60ML STRL (SYRINGE) ×2 IMPLANT
TOWEL OR 17X26 10 PK STRL BLUE (TOWEL DISPOSABLE) ×2 IMPLANT
TOWEL OR NON WOVEN STRL DISP B (DISPOSABLE) ×2 IMPLANT
TUBING CONNECTING 10 (TUBING) ×2 IMPLANT

## 2021-03-09 NOTE — Transfer of Care (Signed)
Immediate Anesthesia Transfer of Care Note  Patient: Gary Pace  Procedure(s) Performed: LEFT THYROID LOBECTOMY (Left: Neck)  Patient Location: PACU  Anesthesia Type:General  Level of Consciousness: awake, drowsy and patient cooperative  Airway & Oxygen Therapy: Patient Spontanous Breathing and Patient connected to face mask oxygen  Post-op Assessment: Report given to RN and Post -op Vital signs reviewed and stable  Post vital signs: Reviewed and stable  Last Vitals:  Vitals Value Taken Time  BP 154/100 03/09/21 1241  Temp    Pulse 58 03/09/21 1244  Resp 14 03/09/21 1244  SpO2 100 % 03/09/21 1244  Vitals shown include unvalidated device data.  Last Pain:  Vitals:   03/09/21 1001  TempSrc: Oral         Complications: No notable events documented.

## 2021-03-09 NOTE — Plan of Care (Signed)
  Problem: Education: Goal: Knowledge of General Education information will improve Description Including pain rating scale, medication(s)/side effects and non-pharmacologic comfort measures Outcome: Progressing   

## 2021-03-09 NOTE — Discharge Instructions (Signed)
[facility] - Dr. Brenda Samano  THYROID & PARATHYROID SURGERY:  POST-OP INSTRUCTIONS  Always review the instruction sheet provided by the hospital nurse at discharge.  A prescription for pain medication may be sent to your pharmacy at the time of discharge.  Take your pain medication as prescribed.  If narcotic pain medicine is not needed, then you may take acetaminophen (Tylenol) or ibuprofen (Advil) as needed for pain or soreness.  Take your normal home medications as prescribed unless otherwise directed.  If you need a refill on your pain medication, please contact the office during regular business hours.  Prescriptions will not be processed by the office after 5:00PM or on weekends.  Start with a light diet upon arrival home, such as soup and crackers or toast.  Be sure to drink plenty of fluids.  Resume your normal diet the day after surgery.  Most patients will experience some swelling and bruising on the chest and neck area.  Ice packs will help for the first 48 hours after arriving home.  Swelling and bruising will take several days to resolve.   It is common to experience some constipation after surgery.  Increasing fluid intake and taking a stool softener (Colace) will usually help to prevent this problem.  A mild laxative (Milk of Magnesia or Miralax) should be taken according to package directions if there has been no bowel movement after 48 hours.  Dermabond glue covers your incision. This seals the wound and you may shower at any time. The Dermabond will remain in place for about a week.  You may gradually remove the glue when it loosens around the edges.  If you need to loosen the Dermabond for removal, apply a layer of Vaseline to the wound for 15 minutes and then remove with a Kleenex. Your sutures are under the skin and will not show - they will dissolve on their own.  You may resume light daily activities beginning the day after discharge (such as self-care,  walking, climbing stairs), gradually increasing activities as tolerated. You may have sexual intercourse when it is comfortable. Refrain from any heavy lifting or straining until approved by your doctor. You may drive when you no longer are taking prescription pain medication, you can comfortably wear a seatbelt, and you can safely maneuver your car and apply the brakes.  You will see your doctor in the office for a follow-up appointment approximately three weeks after your surgery.  Make sure that you call for this appointment within a day or two after you arrive home to insure a convenient appointment time. Please have any requested laboratory tests performed a few days prior to your office visit so that the results will be available at your follow up appointment.  WHEN TO CALL THE [facility] OFFICE: -- Fever greater than 101.5 -- Inability to urinate -- Nausea and/or vomiting - persistent -- Extreme swelling or bruising -- Continued bleeding from incision -- Increased pain, redness, or drainage from the incision -- Difficulty swallowing or breathing -- Muscle cramping or spasms -- Numbness or tingling in hands or around lips  The clinic staff is available to answer your questions during regular business hours.  Please don't hesitate to call and ask to speak to one of the nurses if you have concerns.  [facility] OFFICE: [phone] (24 hours)  Please sign up for MyChart accounts. This will allow you to communicate directly with my nurse or myself without having to call the office. It will also allow you   to view your test results. You will need to enroll in MyChart for my office ([facility]) and for the hospital ().  Wilsie Kern, MD [facility] practice 

## 2021-03-09 NOTE — Plan of Care (Signed)
Report received from Sharyn Lull, RN. Pt oriented to call bell and room. VS taken upon admission.   Problem: Education: Goal: Knowledge of General Education information will improve Description: Including pain rating scale, medication(s)/side effects and non-pharmacologic comfort measures Outcome: Progressing   Problem: Health Behavior/Discharge Planning: Goal: Ability to manage health-related needs will improve Outcome: Progressing   Problem: Clinical Measurements: Goal: Ability to maintain clinical measurements within normal limits will improve Outcome: Progressing Goal: Will remain free from infection Outcome: Progressing   Problem: Activity: Goal: Risk for activity intolerance will decrease Outcome: Progressing   Problem: Nutrition: Goal: Adequate nutrition will be maintained Outcome: Progressing   Problem: Coping: Goal: Level of anxiety will decrease Outcome: Progressing   Problem: Elimination: Goal: Will not experience complications related to bowel motility Outcome: Progressing Goal: Will not experience complications related to urinary retention Outcome: Progressing   Problem: Pain Managment: Goal: General experience of comfort will improve Outcome: Progressing   Problem: Safety: Goal: Ability to remain free from injury will improve Outcome: Progressing

## 2021-03-09 NOTE — Anesthesia Preprocedure Evaluation (Addendum)
Anesthesia Evaluation  Patient identified by MRN, date of birth, ID band Patient awake    Reviewed: Allergy & Precautions, NPO status , Patient's Chart, lab work & pertinent test results  Airway Mallampati: I       Dental no notable dental hx.    Pulmonary    Pulmonary exam normal        Cardiovascular negative cardio ROS Normal cardiovascular exam     Neuro/Psych negative neurological ROS  negative psych ROS   GI/Hepatic Neg liver ROS, GERD  Medicated and Controlled,  Endo/Other  negative endocrine ROS  Renal/GU negative Renal ROS  negative genitourinary   Musculoskeletal negative musculoskeletal ROS (+)   Abdominal (+) + obese,   Peds  Hematology negative hematology ROS (+)   Anesthesia Other Findings    Normal systolic function, impaired relaxation. Otherwise okay echocardiogram. Keep follow-up appointment.    Reproductive/Obstetrics                            Anesthesia Physical Anesthesia Plan  ASA: 2  Anesthesia Plan: General   Post-op Pain Management:    Induction: Intravenous  PONV Risk Score and Plan: 3 and Ondansetron, Dexamethasone and Midazolam  Airway Management Planned: Oral ETT  Additional Equipment: None  Intra-op Plan:   Post-operative Plan: Extubation in OR  Informed Consent: I have reviewed the patients History and Physical, chart, labs and discussed the procedure including the risks, benefits and alternatives for the proposed anesthesia with the patient or authorized representative who has indicated his/her understanding and acceptance.     Dental advisory given  Plan Discussed with: CRNA  Anesthesia Plan Comments:         Anesthesia Quick Evaluation

## 2021-03-09 NOTE — Op Note (Signed)
Procedure Note  Pre-operative Diagnosis:  Left thyroid nodule  Post-operative Diagnosis:  same  Surgeon:  Armandina Gemma, MD  Assistant:  none   Procedure:  Left thyroid lobectomy  Anesthesia:  General  Estimated Blood Loss:  minimal  Drains: none         Specimen: thyroid lobe to pathology  Indications:  Patient is referred by Dr. Vivia Ewing for surgical evaluation and management of newly diagnosed thyroid nodule. Patient had noted a mass in the left anterior neck on self-examination. He also noted globus sensation and complained of dysphagia. The patient did undergo an upper endoscopy with dilatation resulting in improvement of his dysphagia. Patient underwent an ultrasound examination was on August 18, 2020. This demonstrated an enlarged left thyroid lobe containing a solitary nodule measuring 5.1 x 3.6 x 3.3 cm. By ultrasound criteria, this was felt to be highly suspicious and biopsy was recommended. Fine-needle aspiration biopsy was performed on September 20, 2020. This showed benign follicular epithelial cells and colloid consistent with a benign nodule, Bethesda category II. Patient has no prior history of thyroid disease. He has never been on thyroid medication. Recent TSH level was normal at 2.11. Patient has had no prior head or neck surgery with the exception of tonsillectomy. Patient does have a family history of thyroid disease in his brother who recently required thyroidectomy. Patient does not know the reason why the brother required surgery. Patient presents today accompanied by his wife.  Procedure Details: Procedure was done in OR #1 at the [facility]. The patient was brought to the operating room and placed in a supine position on the operating room table. Following administration of general anesthesia, the patient was positioned and then prepped and draped in the usual aseptic fashion. After ascertaining that an adequate level of anesthesia had been achieved, a small  Kocher incision was made with #15 blade. Dissection was carried through subcutaneous tissues and platysma. Hemostasis was achieved with the electrocautery. Skin flaps were elevated cephalad and caudad from the thyroid notch to the sternal notch. A self-retaining retractor was placed for exposure. Strap muscles were incised in the midline and dissection was begun on the left side. Strap muscles were reflected laterally. The left thyroid lobe was markedly enlarged and lobular. The lobe was gently mobilized with blunt dissection. Superior pole vessels were dissected out and divided individually between small and medium ligaclips with the Ligasure Exact. The thyroid lobe was rolled anteriorly. Branches of the inferior thyroid artery were divided between small ligaclips with the Ligasure Exact. Inferior venous tributaries were divided between ligaclips. Both the superior and inferior parathyroid glands were identified and preserved on their vascular pedicles. The recurrent laryngeal nerve was identified and preserved along its course. The ligament of Gwenlyn Found was released with the electrocautery and the gland was mobilized onto the anterior trachea. Isthmus was mobilized across the midline. There was no pyramidal lobe present. The thyroid parenchyma was transected at the junction of the isthmus and contralateral thyroid lobe with the Ligasure Exact. The thyroid lobe and isthmus were submitted to pathology for review.  The neck was irrigated with warm saline. Fibrillar was placed throughout the operative field. Strap muscles were approximated in the midline with interrupted 3-0 Vicryl sutures. Platysma was closed with interrupted 3-0 Vicryl sutures. Skin was closed with a running 4-0 Monocryl subcuticular suture.  Wound was washed and dried and Dermabond was applied. The patient was awakened from anesthesia and brought to the recovery room. The patient tolerated the procedure  well.   Armandina Gemma, MD [facility] Office: [phone]

## 2021-03-09 NOTE — Anesthesia Postprocedure Evaluation (Signed)
Anesthesia Post Note  Patient: Gary Pace  Procedure(s) Performed: LEFT THYROID LOBECTOMY (Left: Neck)     Patient location during evaluation: PACU Anesthesia Type: General Level of consciousness: awake and sedated Pain management: pain level controlled Vital Signs Assessment: post-procedure vital signs reviewed and stable Respiratory status: spontaneous breathing Cardiovascular status: stable Postop Assessment: no apparent nausea or vomiting Anesthetic complications: no   No notable events documented.  Last Vitals:  Vitals:   03/09/21 1300 03/09/21 1315  BP: 139/84 (!) 154/94  Pulse: 63 (!) 54  Resp: 12 16  Temp:    SpO2: 95% 98%    Last Pain:  Vitals:   03/09/21 1315  TempSrc:   PainSc: 4                  John F [address]

## 2021-03-09 NOTE — Anesthesia Procedure Notes (Signed)
Procedure Name: Intubation Date/Time: 03/09/2021 10:59 AM Performed by: West Pugh, CRNA Pre-anesthesia Checklist: Patient identified, Emergency Drugs available, Suction available, Patient being monitored and Timeout performed Patient Re-evaluated:Patient Re-evaluated prior to induction Oxygen Delivery Method: Circle system utilized Preoxygenation: Pre-oxygenation with 100% oxygen Induction Type: IV induction Ventilation: Mask ventilation without difficulty Laryngoscope Size: Mac and 4 Grade View: Grade II Tube type: Oral Tube size: 7.5 mm Number of attempts: 1 Airway Equipment and Method: Stylet Placement Confirmation: ETT inserted through vocal cords under direct vision, positive ETCO2, CO2 detector and breath sounds checked- equal and bilateral Secured at: 22 cm Tube secured with: Tape Dental Injury: Teeth and Oropharynx as per pre-operative assessment

## 2021-03-09 NOTE — Interval H&P Note (Signed)
History and Physical Interval Note:  03/09/2021 10:36 AM  Gary Pace  has presented today for surgery, with the diagnosis of LEFT THYROID NODULE.  The various methods of treatment have been discussed with the patient and family. After consideration of risks, benefits and other options for treatment, the patient has consented to    Procedure(s): LEFT THYROID LOBECTOMY (Left) as a surgical intervention.    The patient's history has been reviewed, patient examined, no change in status, stable for surgery.  I have reviewed the patient's chart and labs.  Questions were answered to the patient's satisfaction.    Darnell Level, MD [facility] practice Office: [phone]   Darnell Level

## 2021-03-10 ENCOUNTER — Encounter (HOSPITAL_COMMUNITY): Payer: Self-pay | Admitting: Surgery

## 2021-03-10 DIAGNOSIS — E042 Nontoxic multinodular goiter: Secondary | ICD-10-CM | POA: Diagnosis not present

## 2021-03-10 DIAGNOSIS — Z8349 Family history of other endocrine, nutritional and metabolic diseases: Secondary | ICD-10-CM | POA: Diagnosis not present

## 2021-03-10 DIAGNOSIS — E041 Nontoxic single thyroid nodule: Secondary | ICD-10-CM | POA: Diagnosis not present

## 2021-03-10 DIAGNOSIS — R1310 Dysphagia, unspecified: Secondary | ICD-10-CM | POA: Diagnosis not present

## 2021-03-10 NOTE — Progress Notes (Signed)
1 Day Post-Op   Subjective/Chief Complaint: No complaints. Voice strong   Objective: Vital signs in last 24 hours: Temp:  [97.6 F (36.4 C)-98.7 F (37.1 C)] 98.7 F (37.1 C) (01/21 0931) Pulse Rate:  [43-87] 47 (01/21 0931) Resp:  [12-18] 18 (01/21 0545) BP: (101-171)/(65-115) 129/78 (01/21 0931) SpO2:  [95 %-100 %] 100 % (01/21 0931) Last BM Date: 03/09/21  Intake/Output from previous day: 01/20 0701 - 01/21 0700 In: 3915 [P.O.:1200; I.V.:2615; IV Piggyback:100] Out: 2300 [Urine:2300] Intake/Output this shift: No intake/output data recorded.  General appearance: alert and cooperative Neck: neck soft, incision looks good Resp: clear to auscultation bilaterally Cardio: regular rate and rhythm GI: soft, non-tender; bowel sounds normal; no masses,  no organomegaly  Lab Results:  Recent Labs    03/08/21 0752  WBC 8.1  HGB 14.4  HCT 43.4  PLT 266   BMET Recent Labs    03/08/21 0752  NA 139  K 3.9  CL 108  CO2 26  GLUCOSE 101*  BUN 12  CREATININE 0.93  CALCIUM 8.9   PT/INR No results for input(s): LABPROT, INR in the last 72 hours. ABG No results for input(s): PHART, HCO3 in the last 72 hours.  Invalid input(s): PCO2, PO2  Studies/Results: No results found.  Anti-infectives: Anti-infectives (From admission, onward)    Start     Dose/Rate Route Frequency Ordered Stop   03/09/21 0930  ceFAZolin (ANCEF) IVPB 2g/100 mL premix        2 g 200 mL/hr over 30 Minutes Intravenous On call to O.R. 03/09/21 0916 03/09/21 1130   03/09/21 0919  ceFAZolin (ANCEF) 2-4 GM/100ML-% IVPB       Note to Pharmacy: Viviano Simas E: cabinet override      03/09/21 0919 03/09/21 1108       Assessment/Plan: s/p Procedure(s): LEFT THYROID LOBECTOMY (Left) Advance diet Discharge  LOS: 0 days    Chevis Pretty III 03/10/2021

## 2021-03-10 NOTE — TOC Transition Note (Signed)
Transition of Care [facility]) - CM/SW Discharge Note   Patient Details  Name: Gary Pace MRN: [id number] Date of Birth: 1975-12-23  [facility] CM/SW Contact:  Golda Acre, RN Phone Number: 03/10/2021, 11:37 AM   Clinical Narrative:    Dcd to home with self care and no toc needs.   Final next level of care: Home/Self Care Barriers to Discharge: No Barriers Identified   Patient Goals and CMS Choice Patient states their goals for this hospitalization and ongoing recovery are:: to go home CMS Medicare.gov Compare Post Acute Care list provided to:: Patient Choice offered to / list presented to : Patient  Discharge Placement                       Discharge Plan and Services   Discharge Planning Services: CM Consult                                 Social Determinants of Health (SDOH) Interventions     Readmission Risk Interventions No flowsheet data found.

## 2021-03-12 LAB — SURGICAL PATHOLOGY

## 2021-03-12 NOTE — Progress Notes (Signed)
Good news!  No sign of cancer.  tmg  Darnell Level, MD [facility] practice Office: [phone]

## 2021-03-29 ENCOUNTER — Telehealth: Payer: Self-pay | Admitting: Internal Medicine

## 2021-03-29 NOTE — Telephone Encounter (Signed)
error 

## 2021-04-12 DIAGNOSIS — Z9009 Acquired absence of other part of head and neck: Secondary | ICD-10-CM | POA: Diagnosis not present

## 2021-04-12 DIAGNOSIS — E041 Nontoxic single thyroid nodule: Secondary | ICD-10-CM | POA: Diagnosis not present

## 2021-04-16 ENCOUNTER — Telehealth: Payer: Self-pay | Admitting: Family

## 2021-04-16 NOTE — Telephone Encounter (Signed)
Pt returning call

## 2021-04-16 NOTE — Telephone Encounter (Signed)
Call pt  Please explain that after lobectomy, he will need to managed by endocrine.  Dr Lonzo Cloud should be able to [facility] and he can ask. If he is released from endocrine in the further, we can discuss my managing  Please advise him to call her office with his concerns

## 2021-04-16 NOTE — Telephone Encounter (Signed)
Patient called and advised on below. He will reach out to endo to advise.

## 2021-04-16 NOTE — Telephone Encounter (Signed)
LMTCB

## 2021-04-16 NOTE — Telephone Encounter (Signed)
Pt called in stating that he had his thyroid surgery on Jan 20th. Pt stated that he did lab work last week. Pt was wondering if NP Arnett can prescribe the medication he needs or do he needs to wait to follow up with the other provider to get the medication. Pt requesting callback.   Pt was wondering if NP Jason Coop is accepting new patient. Pt stated his son needs a pcp

## 2021-04-16 NOTE — Telephone Encounter (Signed)
Pt was concerned about who would be prescribing the thyroid medication levothyroxine needed after lobectomy. He does follow-up with Dr. Gerrit Friends & then sees Dr. Lonzo Cloud in May. He felt that was long to wait & you can see thyroid lab was elevated in care everywhere. He also hopes that you would follow thyroid anyway due to [place] being so far from him living in [place], I was unsure who or when medication should ne started after surgery?

## 2021-04-26 ENCOUNTER — Other Ambulatory Visit: Payer: Self-pay

## 2021-04-26 DIAGNOSIS — R351 Nocturia: Secondary | ICD-10-CM

## 2021-05-01 ENCOUNTER — Encounter: Payer: Self-pay | Admitting: Internal Medicine

## 2021-05-01 ENCOUNTER — Other Ambulatory Visit: Payer: Self-pay

## 2021-05-01 ENCOUNTER — Ambulatory Visit: Payer: BC Managed Care – PPO | Admitting: Internal Medicine

## 2021-05-01 VITALS — BP 120/80 | HR 61 | Ht 66.0 in | Wt 243.0 lb

## 2021-05-01 DIAGNOSIS — E041 Nontoxic single thyroid nodule: Secondary | ICD-10-CM

## 2021-05-01 DIAGNOSIS — E890 Postprocedural hypothyroidism: Secondary | ICD-10-CM

## 2021-05-01 DIAGNOSIS — E559 Vitamin D deficiency, unspecified: Secondary | ICD-10-CM | POA: Diagnosis not present

## 2021-05-01 DIAGNOSIS — Z902 Acquired absence of lung [part of]: Secondary | ICD-10-CM | POA: Insufficient documentation

## 2021-05-01 LAB — TSH: TSH: 9.82 u[IU]/mL — ABNORMAL HIGH (ref 0.35–5.50)

## 2021-05-01 LAB — VITAMIN D 25 HYDROXY (VIT D DEFICIENCY, FRACTURES): VITD: 16.19 ng/mL — ABNORMAL LOW (ref 30.00–100.00)

## 2021-05-01 LAB — T4, FREE: Free T4: 0.61 ng/dL (ref 0.60–1.60)

## 2021-05-01 MED ORDER — LEVOTHYROXINE SODIUM 50 MCG PO TABS: 50.0000 ug | ORAL_TABLET | Freq: Every day | ORAL | 3 refills | Status: DC

## 2021-05-01 MED ORDER — VITAMIN D (ERGOCALCIFEROL) 1.25 MG (50000 UNIT) PO CAPS: 50000.0000 [IU] | ORAL_CAPSULE | ORAL | 1 refills | Status: DC

## 2021-05-01 NOTE — Progress Notes (Signed)
? ?Name: Gary Pace  ?MRN/ DOB: [id number], Jan 01, 1976    ?Age/ Sex: 46 y.o., male   ? ? ?PCP: Allegra Grana, FNP   ?Reason for Endocrinology Evaluation: Left thyroid nodule  ?   ?Initial Endocrinology Clinic Visit: 09/11/2020  ? ? ?PATIENT IDENTIFIER: Gary Pace is a 46 y.o., male with a past medical history of Prediabetes, and GERD. He has followed with [facility] since 09/11/2020 for consultative assistance with management of his left thyroid nodule .  ? ?HISTORICAL SUMMARY:  ?He has been noted with thyromegaly during physical exam which prompted a thyroid ultrasound demonstrating a 5.1 cm left thyroid nodule, meeting FNA criteria. Pt is S/P FNA with benign cytology 09/20/2020 ? ? ?He has hx of esophageal dilatation  ? ? ? ?S/P left lobectomy with benign pathology 03/09/2021 ? ? ?Mother and brother with thyroid disease  ?  ?SUBJECTIVE:  ? ? ? ?Today (05/01/2021):  Gary Pace is here for a follow up on left thyroid nodule.  ? ?S/P left lobectomy with benign pathology 03/09/2021 ? ?He has been noted with weight gain  ?Denies constipation  ?Has noted more fatigue since the surgery  ?Has noted achy muscles with mowing.  ?He is not any vitamins  ?Local neck incisions has healed  ? ? ? ?HISTORY:  ?Past Medical History:  ?Past Medical History:  ?Diagnosis Date  ? Cardiomyopathy (HCC)   ? COVID-19 03/08/2019  ? Dyspnea   ? Post Covid  ? Enlarged prostate   ? Followed by Dr. Achilles Dunk  ? GERD (gastroesophageal reflux disease)   ? ?Past Surgical History:  ?Past Surgical History:  ?Procedure Laterality Date  ? CARPAL TUNNEL RELEASE Right   ? [facility]  ? COLONOSCOPY WITH PROPOFOL N/A 12/14/2020  ? Procedure: COLONOSCOPY WITH PROPOFOL;  Surgeon: Toney Reil, MD;  Location: [facility];  Service: Endoscopy;  Laterality: N/A;  ? THYROID LOBECTOMY Left 03/09/2021  ? Procedure: LEFT THYROID LOBECTOMY;  Surgeon: Darnell Level, MD;  Location: [facility];  Service: General;  Laterality: Left;  ?  TONSILLECTOMY AND ADENOIDECTOMY  2008  ? ?Social History:  reports that he has never smoked. He has never used smokeless tobacco. He reports that he does not drink alcohol and does not use drugs. ?Family History:  ?Family History  ?Problem Relation Age of Onset  ? Thyroid disease Brother   ? Cancer Paternal Grandfather 27  ?     prostate cancer  ? Stroke Paternal Grandfather   ? Diabetes Paternal Grandfather   ? Other Paternal Grandfather   ? Heart attack Paternal Grandfather   ? Cancer Paternal Aunt   ?     breast, brain  ? Other Mother   ?     unknown medical history  ? Other Father   ?     unknown medical history  ? AAA (abdominal aortic aneurysm) Paternal Aunt   ? Thyroid cancer Neg Hx   ? ? ? ?HOME MEDICATIONS: ?Allergies as of 05/01/2021   ?Not on File ?  ? ?  ?Medication List  ?  ? ?  ? Accurate as of May 01, 2021 12:10 PM. If you have any questions, ask your nurse or doctor.  ?  ?  ? ?  ? ?B-12 PO ?Take 1 tablet by mouth once a week. ?  ?ibuprofen 200 MG tablet ?Commonly known as: ADVIL ?Take 600 mg by mouth every 6 (six) hours as needed for moderate pain. ?  ?ipratropium 0.06 %  nasal spray ?Commonly known as: ATROVENT ?Place 2 sprays into both nostrils 4 (four) times daily. ?  ?ketotifen 0.025 % ophthalmic solution ?Commonly known as: ZADITOR ?Place 1 drop into both eyes 2 (two) times daily as needed (allergies). ?  ?pantoprazole 20 MG tablet ?Commonly known as: PROTONIX ?TAKE 1 TABLET(20 MG) BY MOUTH DAILY ?  ?traMADol 50 MG tablet ?Commonly known as: ULTRAM ?Take 1-2 tablets (50-100 mg total) by mouth every 6 (six) hours as needed. ?  ? ?  ? ? ? ? ?OBJECTIVE:  ? ?PHYSICAL EXAM: ?VS: BP 120/80 (BP Location: Left Arm, Patient Position: Sitting, Cuff Size: Large)   Pulse 61   Ht 5\' 6"  (1.676 m)   Wt 243 lb (110.2 kg)   SpO2 95%   BMI 39.22 kg/m?   ? ?EXAM: ?General: Pt appears well and is in NAD  ?Neck: General: Supple without adenopathy. ?Thyroid: Left thyroid nodule appreciated   ?Lungs: Clear with  good BS bilat with no rales, rhonchi, or wheezes  ?Heart: Auscultation: RRR.  ?Abdomen: Normoactive bowel sounds, soft, nontender, without masses or organomegaly palpable  ?Extremities:  ?BL LE: No pretibial edema normal ROM and strength.  ?Mental Status: Judgment, insight: Intact ?Orientation: Oriented to time, place, and person ?Mood and affect: No depression, anxiety, or agitation  ? ? ? ?DATA REVIEWED: ? ? Latest Reference Range & Units 05/01/21 12:19  ?VITD 30.00 - 100.00 ng/mL 16.19 (L)  ? ? Latest Reference Range & Units 05/01/21 12:19  ?TSH 0.35 - 5.50 uIU/mL 9.82 (H)  ?T4,Free(Direct) 0.60 - 1.60 ng/dL 05/03/21  ? ? ? ?1.02 ?TSH 6.210 uIU/mL  ? ? ?Pathology report 03/09/2021 ? ? ?Clinical History: Left thyroid nodule (crm)  ? ? ? ? ?FINAL MICROSCOPIC DIAGNOSIS:  ? ?A. THYROID, LEFT, LOBECTOMY (31.0 G):  ?Multinodular goiter with dominant nodule  ?Negative for carcinoma ? ? ? ? ?FNA left thyroid nodule 09/20/2020 ? ?A. THYROID, LEFT; ULTRASOUND-GUIDED FINE NEEDLE ASPIRATION:  ?- BENIGN (BETHESDA DIAGNOSTIC CATEGORY II).  ?- BENIGN FOLLICULAR EPITHELIAL CELLS AND COLLOID.  ? ?ASSESSMENT / PLAN / RECOMMENDATIONS:  ? ?Left thyroid Nodule , S/P Left Lobectomy  ? ? ?- No local neck symptoms  ?- S/P benign FNA of the left thyroid nodule 09/2020 ?- S/P left lobectomy with benign pathology  ?-Patient is clinically hypothyroid, TSH is trending up to 9.82 you IU/mL, will start levothyroxine ? ? ?Medication ?Start levothyroxine 50 mcg daily ? ? ? ?2.  Vitamin D deficiency ? ? ?Start ergocalciferol 50,000 IU weekly ? ? ? ? ?F/U in 4 months  ? ?Signed electronically by: ?Abby 10/2020, MD ? ?[facility]  ?[facility] ?[address] ?[place], [place] ?Phone: [phone] ?FAX: [phone]  ? ? ? ? ?CC: ?[phone], FNP ?[phone] ?[place] ?Phone: [phone]  ?Fax: [phone] ? ? ?Return to Endocrinology clinic as below: ?Future  Appointments  ?Date Time Provider Department Center  ?05/03/2021  9:30 AM [facility] None  ?05/09/2021  1:00 PM 05/11/2021, MD [facility] None  ?  ? ?

## 2021-05-03 ENCOUNTER — Other Ambulatory Visit: Payer: Self-pay

## 2021-05-03 ENCOUNTER — Other Ambulatory Visit: Payer: BC Managed Care – PPO

## 2021-05-03 DIAGNOSIS — R351 Nocturia: Secondary | ICD-10-CM | POA: Diagnosis not present

## 2021-05-03 DIAGNOSIS — N401 Enlarged prostate with lower urinary tract symptoms: Secondary | ICD-10-CM | POA: Diagnosis not present

## 2021-05-04 LAB — PSA: Prostate Specific Ag, Serum: 0.2 ng/mL (ref 0.0–4.0)

## 2021-05-09 ENCOUNTER — Ambulatory Visit (INDEPENDENT_AMBULATORY_CARE_PROVIDER_SITE_OTHER): Payer: BC Managed Care – PPO | Admitting: Urology

## 2021-05-09 ENCOUNTER — Encounter: Payer: Self-pay | Admitting: Urology

## 2021-05-09 ENCOUNTER — Other Ambulatory Visit: Payer: Self-pay

## 2021-05-09 VITALS — BP 131/84 | HR 54 | Ht 66.0 in | Wt 230.0 lb

## 2021-05-09 DIAGNOSIS — Z125 Encounter for screening for malignant neoplasm of prostate: Secondary | ICD-10-CM | POA: Diagnosis not present

## 2021-05-09 DIAGNOSIS — R351 Nocturia: Secondary | ICD-10-CM

## 2021-05-09 DIAGNOSIS — N401 Enlarged prostate with lower urinary tract symptoms: Secondary | ICD-10-CM

## 2021-05-09 LAB — BLADDER SCAN AMB NON-IMAGING: Scan Result: 5

## 2021-05-09 NOTE — Patient Instructions (Signed)
Prostate Cancer Screening ?Prostate cancer screening is testing that is done to check for the presence of prostate cancer in men. The prostate gland is a walnut-sized gland that is located below the bladder and in front of the rectum in males. The function of the prostate is to add fluid to semen during ejaculation. Prostate cancer is one of the most common types of cancer in men. ?Who should have prostate cancer screening? ?Screening recommendations vary based on age and other risk factors, as well as between the professional organizations who make the recommendations. ?In general, screening is recommended if: ?You are age 50 to 70 and have an average risk for prostate cancer. You should talk with your health care provider about your need for screening and how often screening should be done. Because most prostate cancers are slow growing and will not cause death, screening in this age group is generally reserved for men who have a 10- to 15-year life expectancy. ?You are younger than age 50, and you have these risk factors: ?Having a father, brother, or uncle who has been diagnosed with prostate cancer. The risk is higher if your family member's cancer occurred at an early age or if you have multiple family members with prostate cancer at an early age. ?Being a male who is Black or is of Caribbean or sub-Saharan African descent. ?In general, screening is not recommended if: ?You are younger than age 40. ?You are between the ages of 40 and 49 and you have no risk factors. ?You are 70 years of age or older. At this age, the risks that screening can cause are greater than the benefits that it may provide. ?If you are at high risk for prostate cancer, your health care provider may recommend that you have screenings more often or that you start screening at a younger age. ?How is screening for prostate cancer done? ?The recommended prostate cancer screening test is a blood test called the prostate-specific antigen (PSA)  test. PSA is a protein that is made in the prostate. As you age, your prostate naturally produces more PSA. Abnormally high PSA levels may be caused by: ?Prostate cancer. ?An enlarged prostate that is not caused by cancer (benign prostatic hyperplasia, or BPH). This condition is very common in older men. ?A prostate gland infection (prostatitis) or urinary tract infection. ?Certain medicines such as male hormones (like testosterone) or other medicines that raise testosterone levels. ?A rectal exam may be done as part of prostate cancer screening to help provide information about the size of your prostate gland. When a rectal exam is performed, it should be done after the PSA level is drawn to avoid any effect on the results. ?Depending on the PSA results, you may need more tests, such as: ?A physical exam to check the size of your prostate gland, if not done as part of screening. ?Blood and imaging tests. ?A procedure to remove tissue samples from your prostate gland for testing (biopsy). This is the only way to know for certain if you have prostate cancer. ?What are the benefits of prostate cancer screening? ?Screening can help to identify cancer at an early stage, before symptoms start and when the cancer can be treated more easily. ?There is a small chance that screening may lower your risk of dying from prostate cancer. The chance is small because prostate cancer is a slow-growing cancer, and most men with prostate cancer die from a different cause. ?What are the risks of prostate cancer screening? ?The main   risk of prostate cancer screening is diagnosing and treating prostate cancer that would never have caused any symptoms or problems. This is called overdiagnosisand overtreatment. PSA screening cannot tell you if your PSA is high due to cancer or a different cause. A prostate biopsy is the only procedure to diagnose prostate cancer. Even the results of a biopsy may not tell you if your cancer needs to be  treated. Slow-growing prostate cancer may not need any treatment other than monitoring, so diagnosing and treating it may cause unnecessary stress or other side effects. ?Questions to ask your health care provider ?When should I start prostate cancer screening? ?What is my risk for prostate cancer? ?How often do I need screening? ?What type of screening tests do I need? ?How do I get my test results? ?What do my results mean? ?Do I need treatment? ?Where to find more information ?The American Cancer Society: www.cancer.org ?American Urological Association: www.auanet.org ?Contact a health care provider if: ?You have difficulty urinating. ?You have pain when you urinate or ejaculate. ?You have blood in your urine or semen. ?You have pain in your back or in the area of your prostate. ?Summary ?Prostate cancer is a common type of cancer in men. The prostate gland is located below the bladder and in front of the rectum. This gland adds fluid to semen during ejaculation. ?Prostate cancer screening may identify cancer at an early stage, when the cancer can be treated more easily and is less likely to have spread to other areas of the body. ?The prostate-specific antigen (PSA) test is the recommended screening test for prostate cancer, but it has associated risks. ?Discuss the risks and benefits of prostate cancer screening with your health care provider. If you are age 70 or older, the risks that screening can cause are greater than the benefits that it may provide. ?This information is not intended to replace advice given to you by your health care provider. Make sure you discuss any questions you have with your health care provider. ?Document Revised: 07/31/2020 Document Reviewed: 07/31/2020 ?Elsevier Patient Education ? 2022 Elsevier Inc. ? ?

## 2021-05-09 NOTE — Progress Notes (Signed)
? ?  05/09/2021 ?1:23 PM  ? ?Gary Pace ?14-Aug-1975 ?[id number] ? ?Reason for visit: Follow up BPH, ED, PSA screening ? ?HPI: ?46 year old healthy male who has been followed previously by Dr. Achilles Dunk, Dr. Mena Goes, and her PA [facility] for the above issues.  It sounds like Dr. Achilles Dunk started him on sildenafil a few years ago for ED, but he never really needed this medication and has no longer been using it.  He also was on alfuzosin at some point for BPH, but discontinued this medication over a year ago.  He has a history of pelvic/scrotal pain with a normal scrotal ultrasound, but has not had any problems with pain over the last few years.  Finally, regarding PSA screening, PSA is normal and stable at 0.2 from 0.2 last year.  DRE last year was benign. ? ?We discussed that at this point he can follow-up as needed as he really has no acute urologic issues or complaints.  We reviewed the AUA guidelines regarding PSA screening, and he can continue screening every other year through age 64 with PCP.  We are happy to see him again if he develops urologic problems in the future. ? ?Follow-up with urology as needed ? ?Sondra Come, MD ? ?[facility] ?[address] ?[place], [place] [address] ?[phone] ? ? ?

## 2021-07-09 ENCOUNTER — Ambulatory Visit: Payer: BC Managed Care – PPO | Admitting: Internal Medicine

## 2021-07-20 DIAGNOSIS — H5203 Hypermetropia, bilateral: Secondary | ICD-10-CM | POA: Diagnosis not present

## 2021-08-08 ENCOUNTER — Ambulatory Visit: Payer: BC Managed Care – PPO | Admitting: Internal Medicine

## 2021-08-08 ENCOUNTER — Encounter: Payer: Self-pay | Admitting: Internal Medicine

## 2021-08-08 VITALS — BP 110/68 | HR 100 | Ht 66.0 in | Wt 242.0 lb

## 2021-08-08 DIAGNOSIS — E890 Postprocedural hypothyroidism: Secondary | ICD-10-CM | POA: Diagnosis not present

## 2021-08-08 DIAGNOSIS — E559 Vitamin D deficiency, unspecified: Secondary | ICD-10-CM | POA: Diagnosis not present

## 2021-08-08 LAB — TSH: TSH: 3.57 u[IU]/mL (ref 0.35–5.50)

## 2021-08-08 LAB — VITAMIN D 25 HYDROXY (VIT D DEFICIENCY, FRACTURES): VITD: 65.06 ng/mL (ref 30.00–100.00)

## 2021-08-08 NOTE — Progress Notes (Signed)
`  Name: Gary Pace  MRN/ DOB: [id number], April 16, 1975    Age/ Sex: 46 y.o., male     PCP: Allegra Grana, FNP   Reason for Endocrinology Evaluation: Left thyroid nodule     Initial Endocrinology Clinic Visit: 09/11/2020    PATIENT IDENTIFIER: [name] is a 46 y.o., male with a past medical history of Prediabetes, and GERD. He has followed with [facility] since 09/11/2020 for consultative assistance with management of his left thyroid nodule .   HISTORICAL SUMMARY:  He has been noted with thyromegaly during physical exam which prompted a thyroid ultrasound demonstrating a 5.1 cm left thyroid nodule, meeting FNA criteria. Pt is S/P FNA with benign cytology 09/20/2020   He has hx of esophageal dilatation     S/P left lobectomy with benign pathology 03/09/2021   Mother and brother with thyroid disease    SUBJECTIVE:     Today (08/08/2021):  Gary Pace is here for a follow up on left thyroid nodule.   S/P left lobectomy with benign pathology 03/09/2021  Weight stable  Denies constipation  Energy has improved  Denies palpitations   Levothyroxine 50 mcg daily   Ergocalciferol 50,000   HISTORY:  Past Medical History:  Past Medical History:  Diagnosis Date   Cardiomyopathy (HCC)    COVID-19 03/08/2019   Dyspnea    Post Covid   Enlarged prostate    Followed by Dr. Achilles Dunk   GERD (gastroesophageal reflux disease)    Past Surgical History:  Past Surgical History:  Procedure Laterality Date   CARPAL TUNNEL RELEASE Right    [facility]   COLONOSCOPY WITH PROPOFOL N/A 12/14/2020   Procedure: COLONOSCOPY WITH PROPOFOL;  Surgeon: Toney Reil, MD;  Location[facility];  Service: Endoscopy;  Laterality: N/A;   THYROID LOBECTOMY Left 03/09/2021   Procedure: LEFT THYROID LOBECTOMY;  Surgeon: Darnell Level, MD;  Location: [facility];  Service: General;  Laterality: Left;   TONSILLECTOMY AND ADENOIDECTOMY  2008   Social History:  reports that  he has never smoked. He has never used smokeless tobacco. He reports that he does not drink alcohol and does not use drugs. Family History:  Family History  Problem Relation Age of Onset   Thyroid disease Brother    Cancer Paternal Grandfather 53       prostate cancer   Stroke Paternal Grandfather    Diabetes Paternal Grandfather    Other Paternal Grandfather    Heart attack Paternal Grandfather    Cancer Paternal Aunt        breast, brain   Other Mother        unknown medical history   Other Father        unknown medical history   AAA (abdominal aortic aneurysm) Paternal Aunt    Thyroid cancer Neg Hx      HOME MEDICATIONS: Allergies as of 08/08/2021   Not on File      Medication List        Accurate as of August 08, 2021  1:48 PM. If you have any questions, ask your nurse or doctor.          ketotifen 0.025 % ophthalmic solution Commonly known as: ZADITOR Place 1 drop into both eyes 2 (two) times daily as needed (allergies).   levothyroxine 50 MCG tablet Commonly known as: SYNTHROID Take 1 tablet (50 mcg total) by mouth daily.   pantoprazole 20 MG tablet Commonly known as: PROTONIX Take 20 mg  by mouth daily.   Vitamin D (Ergocalciferol) 1.25 MG (50000 UNIT) Caps capsule Commonly known as: DRISDOL Take 50,000 Units by mouth once a week.          OBJECTIVE:   PHYSICAL EXAM: VS: BP 110/68 (BP Location: Left Arm, Patient Position: Sitting, Cuff Size: Large)   Pulse 100   Ht 5\' 6"  (1.676 m)   Wt 242 lb (109.8 kg)   SpO2 98%   BMI 39.06 kg/m    EXAM: General: Pt appears well and is in NAD  Neck: General: Supple without adenopathy. Thyroid: Left thyroid nodule appreciated   Lungs: Clear with good BS bilat with no rales, rhonchi, or wheezes  Heart: Auscultation: RRR.  Abdomen: Normoactive bowel sounds, soft, nontender, without masses or organomegaly palpable  Extremities:  BL LE: No pretibial edema normal ROM and strength.  Mental Status:  Judgment, insight: Intact Orientation: Oriented to time, place, and person Mood and affect: No depression, anxiety, or agitation     DATA REVIEWED:   Latest Reference Range & Units 05/01/21 12:19  VITD 30.00 - 100.00 ng/mL 16.19 (L)    Latest Reference Range & Units 05/01/21 12:19  TSH 0.35 - 5.50 uIU/mL 9.82 (H)  T4,Free(Direct) 0.60 - 1.60 ng/dL 05/03/21     3.66 TSH 6.210 uIU/mL    Pathology report 03/09/2021   Clinical History: Left thyroid nodule (crm)      FINAL MICROSCOPIC DIAGNOSIS:   A. THYROID, LEFT, LOBECTOMY (31.0 G):  Multinodular goiter with dominant nodule  Negative for carcinoma     FNA left thyroid nodule 09/20/2020  A. THYROID, LEFT; ULTRASOUND-GUIDED FINE NEEDLE ASPIRATION:  - BENIGN (BETHESDA DIAGNOSTIC CATEGORY II).  - BENIGN FOLLICULAR EPITHELIAL CELLS AND COLLOID.   ASSESSMENT / PLAN / RECOMMENDATIONS:   Left thyroid Nodule , S/P Left Lobectomy    - No local neck symptoms  - S/P benign FNA of the left thyroid nodule 09/2020 - S/P left lobectomy with benign pathology  -Patient is clinically hypothyroid, TSH is trending up to 9.82 you IU/mL, will start levothyroxine   Medication Start levothyroxine 50 mcg daily    2.  Vitamin D deficiency   Start ergocalciferol 50,000 IU weekly     F/U in 4 months   Signed electronically by: 10/2020, MD  [facility]  [facility] [address] [place] [place], [place] Phone: [phone] FAX: [phone]      CC: [phone], FNP [address] [place] Phone: [phone]  Fax: [phone]   Return to Endocrinology clinic as below: No future appointments.

## 2021-08-08 NOTE — Patient Instructions (Signed)

## 2021-08-09 NOTE — Progress Notes (Signed)
`  Name: Gary Pace  MRN/ DOB: [id number], Dec 17, 1975    Age/ Sex: 46 y.o., male     PCP: Allegra Grana, FNP   Reason for Endocrinology Evaluation: Left thyroid nodule     Initial Endocrinology Clinic Visit: 09/11/2020    PATIENT IDENTIFIER: Gary Pace is a 46 y.o., male with a past medical history of Prediabetes, and GERD. He has followed with [facility] since 09/11/2020 for consultative assistance with management of his left thyroid nodule .   HISTORICAL SUMMARY:  He has been noted with thyromegaly during physical exam which prompted a thyroid ultrasound demonstrating a 5.1 cm left thyroid nodule, meeting FNA criteria. Pt is S/P FNA with benign cytology 09/20/2020   He has hx of esophageal dilatation     S/P left lobectomy with benign pathology 03/09/2021   Mother and brother with thyroid disease    SUBJECTIVE:     Today (08/08/2021):  Mr. Fidalgo is here for a follow up on left thyroid nodule.   S/P left lobectomy with benign pathology 03/09/2021  Weight stable  Denies constipation  Energy has improved  Denies palpitations   Levothyroxine 50 mcg daily   Ergocalciferol 50,000   HISTORY:  Past Medical History:  Past Medical History:  Diagnosis Date   Cardiomyopathy (HCC)    COVID-19 03/08/2019   Dyspnea    Post Covid   Enlarged prostate    Followed by Dr. Achilles Dunk   GERD (gastroesophageal reflux disease)    Past Surgical History:  Past Surgical History:  Procedure Laterality Date   CARPAL TUNNEL RELEASE Right    [facility]   COLONOSCOPY WITH PROPOFOL N/A 12/14/2020   Procedure: COLONOSCOPY WITH PROPOFOL;  Surgeon: Toney Reil, MD;  Location: [facility];  Service: Endoscopy;  Laterality: N/A;   THYROID LOBECTOMY Left 03/09/2021   Procedure: LEFT THYROID LOBECTOMY;  Surgeon: Darnell Level, MD;  Location: [facility];  Service: General;  Laterality: Left;   TONSILLECTOMY AND ADENOIDECTOMY  2008   Social History:  reports that  he has never smoked. He has never used smokeless tobacco. He reports that he does not drink alcohol and does not use drugs. Family History:  Family History  Problem Relation Age of Onset   Thyroid disease Brother    Cancer Paternal Grandfather 67       prostate cancer   Stroke Paternal Grandfather    Diabetes Paternal Grandfather    Other Paternal Grandfather    Heart attack Paternal Grandfather    Cancer Paternal Aunt        breast, brain   Other Mother        unknown medical history   Other Father        unknown medical history   AAA (abdominal aortic aneurysm) Paternal Aunt    Thyroid cancer Neg Hx      HOME MEDICATIONS: Allergies as of 08/08/2021   Not on File      Medication List        Accurate as of August 08, 2021  1:48 PM. If you have any questions, ask your nurse or doctor.          ketotifen 0.025 % ophthalmic solution Commonly known as: ZADITOR Place 1 drop into both eyes 2 (two) times daily as needed (allergies).   levothyroxine 50 MCG tablet Commonly known as: SYNTHROID Take 1 tablet (50 mcg total) by mouth daily.   pantoprazole 20 MG tablet Commonly known as: PROTONIX Take 20 mg  by mouth daily.   Vitamin D (Ergocalciferol) 1.25 MG (50000 UNIT) Caps capsule Commonly known as: DRISDOL Take 50,000 Units by mouth once a week.          OBJECTIVE:   PHYSICAL EXAM: VS: BP 110/68 (BP Location: Left Arm, Patient Position: Sitting, Cuff Size: Large)   Pulse 100   Ht 5\' 6"  (1.676 m)   Wt 242 lb (109.8 kg)   SpO2 98%   BMI 39.06 kg/m    EXAM: General: Pt appears well and is in NAD  Neck: General: Supple without adenopathy. Thyroid: Left thyroid nodule appreciated   Lungs: Clear with good BS bilat with no rales, rhonchi, or wheezes  Heart: Auscultation: RRR.  Abdomen: Normoactive bowel sounds, soft, nontender, without masses or organomegaly palpable  Extremities:  BL LE: No pretibial edema normal ROM and strength.  Mental Status:  Judgment, insight: Intact Orientation: Oriented to time, place, and person Mood and affect: No depression, anxiety, or agitation     DATA REVIEWED:  Latest Reference Range & Units 08/08/21 14:22  TSH 0.35 - 5.50 uIU/mL 3.57    Latest Reference Range & Units 08/08/21 14:22  VITD 30.00 - 100.00 ng/mL 65.06    04/12/2021 TSH 6.210 uIU/mL    Pathology report 03/09/2021   Clinical History: Left thyroid nodule (crm)      FINAL MICROSCOPIC DIAGNOSIS:   A. THYROID, LEFT, LOBECTOMY (31.0 G):  Multinodular goiter with dominant nodule  Negative for carcinoma     FNA left thyroid nodule 09/20/2020  A. THYROID, LEFT; ULTRASOUND-GUIDED FINE NEEDLE ASPIRATION:  - BENIGN (BETHESDA DIAGNOSTIC CATEGORY II).  - BENIGN FOLLICULAR EPITHELIAL CELLS AND COLLOID.   ASSESSMENT / PLAN / RECOMMENDATIONS:   Left thyroid Nodule , S/P Left Lobectomy    - No local neck symptoms  - S/P left lobectomy with benign pathology  -Patient is clinically and biochemically  euthyroid    Medication Continue levothyroxine 50 mcg daily    2.  Vitamin D deficiency  - Was 16.9 but this has resolved , will switch from Ergocalciferol to OTC Vitamin D3    Stop ergocalciferol 50,000 IU weekly Start Vitamin D3 2000 iu daily      F/U in 6 months   Signed electronically by: 11/20/2020, MD  [facility]  [facility] [address] [place] [place], [place] Phone: [phone] FAX: [phone]      CC: [phone], FNP [address] [place] Phone: [phone]  Fax: [phone]   Return to Endocrinology clinic as below: No future appointments.

## 2021-11-04 ENCOUNTER — Other Ambulatory Visit: Payer: Self-pay | Admitting: Internal Medicine

## 2021-11-12 ENCOUNTER — Telehealth: Payer: BC Managed Care – PPO | Admitting: Physician Assistant

## 2021-11-12 DIAGNOSIS — L02419 Cutaneous abscess of limb, unspecified: Secondary | ICD-10-CM

## 2021-11-12 DIAGNOSIS — L03119 Cellulitis of unspecified part of limb: Secondary | ICD-10-CM

## 2021-11-13 MED ORDER — SULFAMETHOXAZOLE-TRIMETHOPRIM 800-160 MG PO TABS: 1.0000 | ORAL_TABLET | Freq: Two times a day (BID) | ORAL | 0 refills | Status: DC

## 2021-11-13 NOTE — Progress Notes (Signed)
E Visit for Cellulitis  We are sorry that you are not feeling well. Here is how we plan to help!  Based on what you shared with me it looks like you have cellulitis and an abscess (boil).  Cellulitis looks like areas of skin redness, swelling, and warmth; it develops as a result of bacteria entering under the skin. Little red spots and/or bleeding can be seen in skin, and tiny surface sacs containing fluid can occur. Fever can be present. Cellulitis is almost always on one side of a body, and the lower limbs are the most common site of involvement.   I have prescribed:  Bactrim DS 1 tablet by mouth twice a day for 7 days  HOME CARE:  Take your medications as ordered and take all of them, even if the skin irritation appears to be healing.   GET HELP RIGHT AWAY IF:  Symptoms that don't begin to go away within 48 hours. Severe redness persists or worsens If the area turns color, spreads or swells. If it blisters and opens, develops yellow-brown crust or bleeds. You develop a fever or chills. If the pain increases or becomes unbearable.  Are unable to keep fluids and food down.  MAKE SURE YOU   Understand these instructions. Will watch your condition. Will get help right away if you are not doing well or get worse.  Thank you for choosing an e-visit.  Your e-visit answers were reviewed by a board certified advanced clinical practitioner to complete your personal care plan. Depending upon the condition, your plan could have included both over the counter or prescription medications.  Please review your pharmacy choice. Make sure the pharmacy is open so you can pick up prescription now. If there is a problem, you may contact your provider through [employer] and have the prescription routed to another pharmacy.  Your safety is important to [place]. If you have drug allergies check your prescription carefully.   For the next 24 hours you can use MyChart to ask questions about today's  visit, request a non-urgent call back, or ask for a work or school excuse. You will get an email in the next two days asking about your experience. I hope that your e-visit has been valuable and will speed your recovery.

## 2021-11-13 NOTE — Progress Notes (Signed)
I have spent 5 minutes in review of e-visit questionnaire, review and updating patient chart, medical decision making and response to patient.   Jerrica Thorman Cody Shalom Ware, PA-C    

## 2022-02-01 ENCOUNTER — Telehealth: Payer: Self-pay

## 2022-02-01 ENCOUNTER — Other Ambulatory Visit: Payer: Self-pay | Admitting: Internal Medicine

## 2022-02-01 DIAGNOSIS — E890 Postprocedural hypothyroidism: Secondary | ICD-10-CM

## 2022-02-01 DIAGNOSIS — E559 Vitamin D deficiency, unspecified: Secondary | ICD-10-CM

## 2022-02-01 NOTE — Telephone Encounter (Signed)
Patient would like to have his lab drawn at [facility] before his appointment on 02/15/22. He has been experiencing fatigue.

## 2022-02-01 NOTE — Telephone Encounter (Signed)
Patient notified

## 2022-02-08 ENCOUNTER — Ambulatory Visit: Payer: BC Managed Care – PPO | Admitting: Internal Medicine

## 2022-02-13 ENCOUNTER — Other Ambulatory Visit: Payer: Self-pay

## 2022-02-13 DIAGNOSIS — E559 Vitamin D deficiency, unspecified: Secondary | ICD-10-CM

## 2022-02-13 DIAGNOSIS — E890 Postprocedural hypothyroidism: Secondary | ICD-10-CM

## 2022-02-14 LAB — TSH: TSH: 3.83 u[IU]/mL (ref 0.450–4.500)

## 2022-02-14 LAB — VITAMIN D 25 HYDROXY (VIT D DEFICIENCY, FRACTURES): Vit D, 25-Hydroxy: 57.7 ng/mL (ref 30.0–100.0)

## 2022-02-15 ENCOUNTER — Encounter: Payer: Self-pay | Admitting: Internal Medicine

## 2022-02-15 ENCOUNTER — Ambulatory Visit: Payer: BC Managed Care – PPO | Admitting: Internal Medicine

## 2022-02-15 VITALS — BP 126/80 | HR 60 | Ht 66.0 in | Wt 244.0 lb

## 2022-02-15 DIAGNOSIS — E559 Vitamin D deficiency, unspecified: Secondary | ICD-10-CM | POA: Diagnosis not present

## 2022-02-15 DIAGNOSIS — E890 Postprocedural hypothyroidism: Secondary | ICD-10-CM

## 2022-02-15 MED ORDER — LEVOTHYROXINE SODIUM 75 MCG PO TABS: 75.0000 ug | ORAL_TABLET | Freq: Every day | ORAL | 3 refills | Status: DC

## 2022-02-15 MED ORDER — VITAMIN D (ERGOCALCIFEROL) 1.25 MG (50000 UNIT) PO CAPS: 50000.0000 [IU] | ORAL_CAPSULE | ORAL | 3 refills | Status: DC

## 2022-02-15 NOTE — Progress Notes (Signed)
`  Name: Gary Pace  MRN/ DOB: [id number], [birth year]    Age/ Sex: 46 y.o., male     PCP: Allegra Grana, FNP   Reason for Endocrinology Evaluation: Left thyroid nodule     Initial Endocrinology Clinic Visit: 09/11/2020    PATIENT IDENTIFIER: [name] is a 46 y.o., male with a past medical history of Prediabetes, and GERD. He has followed with [place] [facility] since 09/11/2020 for consultative assistance with management of his left thyroid nodule .   HISTORICAL SUMMARY:  He has been noted with thyromegaly during physical exam which prompted a thyroid ultrasound demonstrating a 5.1 cm left thyroid nodule, meeting FNA criteria. 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   Mother and brother with thyroid disease    SUBJECTIVE:     Today (02/15/2022):  Gary Pace is here for a follow up on postoperative hypothyroidism  S/P left lobectomy with benign pathology 03/09/2021  Weight stable since June,2023 He has occasional fatigue  Denies insomnia  Denies constipation  Denies depression  Denies palpitations  Denies local neck swelling but has occasional pain radiates to the pain   Levothyroxine 50 mcg daily  Ergocalciferol 50,000 iu weekly     HISTORY:  Past Medical History:  Past Medical History:  Diagnosis Date   Cardiomyopathy (HCC)    COVID-19 03/08/2019   Dyspnea    Post Covid   Enlarged prostate    Followed by Dr. Achilles Dunk   GERD (gastroesophageal reflux disease)    Past Surgical History:  Past Surgical History:  Procedure Laterality Date   CARPAL TUNNEL RELEASE Right    [facility]   COLONOSCOPY WITH PROPOFOL N/A 12/14/2020   Procedure: COLONOSCOPY WITH PROPOFOL;  Surgeon: Toney Reil, MD;  Location: [facility];  Service: Endoscopy;  Laterality: N/A;   THYROID LOBECTOMY Left 03/09/2021   Procedure: LEFT THYROID LOBECTOMY;  Surgeon:  Darnell Level, MD;  Location: [facility];  Service: General;  Laterality: Left;   TONSILLECTOMY AND ADENOIDECTOMY  2008   Social History:  reports that he has never smoked. He has never used smokeless tobacco. He reports that he does not drink alcohol and does not use drugs. Family History:  Family History  Problem Relation Age of Onset   Thyroid disease Brother    Cancer Paternal Grandfather 11       prostate cancer   Stroke Paternal Grandfather    Diabetes Paternal Grandfather    Other Paternal Grandfather    Heart attack Paternal Grandfather    Cancer Paternal Aunt        breast, brain   Other Mother        unknown medical history   Other Father        unknown medical history   AAA (abdominal aortic aneurysm) Paternal Aunt    Thyroid cancer Neg Hx      HOME MEDICATIONS: Allergies as of 02/15/2022   Not on File      Medication List        Accurate as of February 15, 2022  1:26 PM. If you have any questions, ask your nurse or doctor.          STOP taking these medications    sulfamethoxazole-trimethoprim 800-160 MG tablet Commonly known as: BACTRIM DS Stopped by: Scarlette Shorts, MD       TAKE these medications  ketotifen 0.025 % ophthalmic solution Commonly known as: ZADITOR Place 1 drop into both eyes 2 (two) times daily as needed (allergies).   levothyroxine 50 MCG tablet Commonly known as: SYNTHROID Take 1 tablet (50 mcg total) by mouth daily.   pantoprazole 20 MG tablet Commonly known as: PROTONIX Take 20 mg by mouth daily.   Vitamin D (Ergocalciferol) 1.25 MG (50000 UNIT) Caps capsule Commonly known as: DRISDOL TAKE 1 CAPSULE BY MOUTH EVERY 7 DAYS          OBJECTIVE:   PHYSICAL EXAM: VS: BP 126/80 (BP Location: Left Arm, Patient Position: Sitting, Cuff Size: Large)   Pulse 60   Ht 5\' 6"  (1.676 m)   Wt 244 lb (110.7 kg)   SpO2 95%   BMI 39.38 kg/m    EXAM: General: Pt appears well and is in NAD  Neck: General: Supple  without adenopathy. Thyroid: no nodules or goiter appreciated   Lungs: Clear with good BS bilat with no rales, rhonchi, or wheezes  Heart: Auscultation: RRR.  Abdomen: Normoactive bowel sounds, soft, nontender, without masses or organomegaly palpable  Extremities:  BL LE: No pretibial edema normal ROM and strength.  Mental Status: Judgment, insight: Intact Orientation: Oriented to time, place, and person Mood and affect: No depression, anxiety, or agitation     DATA REVIEWED:  Latest Reference Range & Units 02/13/22 13:46  Vitamin D, 25-Hydroxy 30.0 - 100.0 ng/mL 57.7    Latest Reference Range & Units 02/13/22 13:46  TSH 0.450 - 4.500 uIU/mL 3.830     04/12/2021 TSH 6.210 uIU/mL    Pathology report 03/09/2021   Clinical History: Left thyroid nodule (crm)      FINAL MICROSCOPIC DIAGNOSIS:   A. THYROID, LEFT, LOBECTOMY (31.0 G):  Multinodular goiter with dominant nodule  Negative for carcinoma     FNA left thyroid nodule 09/20/2020  A. THYROID, LEFT; ULTRASOUND-GUIDED FINE NEEDLE ASPIRATION:  - BENIGN (BETHESDA DIAGNOSTIC CATEGORY II).  - BENIGN FOLLICULAR EPITHELIAL CELLS AND COLLOID.   ASSESSMENT / PLAN / RECOMMENDATIONS:   Left thyroid Nodule , S/P Left Lobectomy    - No local neck symptoms  - S/P left lobectomy with benign pathology  -Patient has fatigue but  biochemically  euthyroid , we discussed increasing levothyroxine to bring his TS < 3 uIU/mL    Medication Stop levothyroxine 50 mcg daily Start Levothyroxine 75 mcg daily     2.  Vitamin D deficiency  - Was 16.9 but this has resolved , pt would like to continue ergocalciferol    Continue  ergocalciferol 50,000 IU weekly    F/U in 6 months   Signed electronically by: 11/20/2020, MD  [facility]  [facility] [address] [place] [place], [place] Phone: [phone] FAX: [phone]      CC: [phone], FNP [address] [place] Phone: [phone]  Fax: [phone]   Return to Endocrinology clinic as below: No future appointments.

## 2022-02-15 NOTE — Patient Instructions (Signed)

## 2022-04-14 ENCOUNTER — Other Ambulatory Visit: Payer: Self-pay | Admitting: Family

## 2022-04-24 LAB — LAB REPORT - SCANNED
A1c: 6
EGFR: 92

## 2022-05-13 ENCOUNTER — Encounter: Payer: Self-pay | Admitting: Family

## 2022-05-13 ENCOUNTER — Ambulatory Visit (INDEPENDENT_AMBULATORY_CARE_PROVIDER_SITE_OTHER): Payer: BC Managed Care – PPO | Admitting: Family

## 2022-05-13 VITALS — BP 128/78 | HR 60 | Temp 97.8°F | Ht 66.0 in | Wt 237.0 lb

## 2022-05-13 DIAGNOSIS — E041 Nontoxic single thyroid nodule: Secondary | ICD-10-CM | POA: Diagnosis not present

## 2022-05-13 DIAGNOSIS — Z0000 Encounter for general adult medical examination without abnormal findings: Secondary | ICD-10-CM

## 2022-05-13 DIAGNOSIS — E669 Obesity, unspecified: Secondary | ICD-10-CM

## 2022-05-13 DIAGNOSIS — R7303 Prediabetes: Secondary | ICD-10-CM | POA: Diagnosis not present

## 2022-05-13 MED ORDER — WEGOVY 0.25 MG/0.5ML ~~LOC~~ SOAJ: 0.2500 mg | SUBCUTANEOUS | 2 refills | Status: DC

## 2022-05-13 NOTE — Progress Notes (Unsigned)
   Assessment & Plan:  There are no diagnoses linked to this encounter.   Return precautions given.   Risks, benefits, and alternatives of the medications and treatment plan prescribed today were discussed, and patient expressed understanding.   Education regarding symptom management and diagnosis given to patient on AVS either electronically or printed.  No follow-ups on file.  [name], FNP  Subjective:    Patient ID: Gary Pace, male    DOB: 1975/06/18, 47 y.o.   MRN: [id number]  CC: Gary Pace is a 47 y.o. male who presents today for physical exam.    HPI: HPI  Colorectal  Cancer Screening: UTD ; 12/14/2020, Dr. Marius Ditch.  Repeat in 10 yrs Prostate Cancer Screening:   Lung Cancer Screening: No 30 year pack year history and > 50 years to 31 years.   Immunizations       Tetanus - UTD         Labs: Screening labs done  Exercise: Gets regular exercise.   Alcohol use:  none Smoking/tobacco use: Nonsmoker.    Health Maintenance  Topic Date Due   INFLUENZA VACCINE  05/19/2022 (Originally 09/18/2021)   DTaP/Tdap/Td (2 - Td or Tdap) 09/10/2025   COLONOSCOPY (Pts 45-38yrs Insurance coverage will need to be confirmed)  12/15/2030   Hepatitis C Screening  Completed   HIV Screening  Completed   HPV VACCINES  Aged Out   COVID-19 Vaccine  Discontinued     ALLERGIES: Patient has no allergy information on record.  Current Outpatient Medications on File Prior to Visit  Medication Sig Dispense Refill   ketotifen (ZADITOR) 0.025 % ophthalmic solution Place 1 drop into both eyes 2 (two) times daily as needed (allergies).     levothyroxine (SYNTHROID) 75 MCG tablet Take 1 tablet (75 mcg total) by mouth daily. 90 tablet 3   pantoprazole (PROTONIX) 20 MG tablet Take 20 mg by mouth daily.     Vitamin D, Ergocalciferol, (DRISDOL) 1.25 MG (50000 UNIT) CAPS capsule Take 1 capsule (50,000 Units total) by mouth every 7 (seven) days. 13 capsule 3   [DISCONTINUED] fluticasone  (FLONASE) 50 MCG/ACT nasal spray Place into both nostrils daily as needed for allergies or rhinitis.     [DISCONTINUED] loratadine (CLARITIN) 10 MG tablet Take 10 mg by mouth daily as needed for allergies.     No current facility-administered medications on file prior to visit.    Review of Systems    Objective:    BP 128/78   Pulse 60   Temp 97.8 F (36.6 C) (Oral)   Ht 5\' 6"  (1.676 m)   Wt 237 lb (107.5 kg)   SpO2 97%   BMI 38.25 kg/m   BP Readings from Last 3 Encounters:  05/13/22 128/78  02/15/22 126/80  08/08/21 110/68   Wt Readings from Last 3 Encounters:  05/13/22 237 lb (107.5 kg)  02/15/22 244 lb (110.7 kg)  08/08/21 242 lb (109.8 kg)    Physical Exam

## 2022-05-13 NOTE — Assessment & Plan Note (Signed)
Prediabetes. ASCVD risk score 4.6% . Discussed lifestyle modifications regarding low trans saturated fat diet.  Start [employer].  Counseled on blackbox warning as relates to medullary thyroid cancer, multiple endocrine neoplasia.  Counseled on side effects, titration.

## 2022-05-14 ENCOUNTER — Other Ambulatory Visit: Payer: Self-pay | Admitting: Internal Medicine

## 2022-05-14 DIAGNOSIS — R7303 Prediabetes: Secondary | ICD-10-CM | POA: Insufficient documentation

## 2022-05-14 NOTE — Patient Instructions (Signed)
We have discussed starting non insulin daily injectable medication called Wegovy  which is a glucagon like peptide (GLP 1) agonist and works by delaying gastric emptying and increasing insulin secretion.It is given once per week. Most patients see significant weight loss with this drug class.   You may NOT take either medication if you or your family has history of thyroid, parathyroid, OR adrenal cancer. Please confirm you and your family does NOT have this history as this drug class has black box warning on this medication for that reason.   Please follow  directions on prescription and slowly increase from 0.'25mg'$  [place] once per week ;stay here for 4 weeks. You may then increase to 0.'5mg'$  [place] once per week and stay there for 4 weeks.  We can slowly titrate further at follow up with goal of no more than 1-2 lbs weight loss per week.  Semaglutide [facility])  Dose (mg) Once Weekly Titration:    If a dose is not tolerated, consider delaying further dose increases for  another 4 weeks.  If you are actively losing weight on a dose, do not increase medication.   Weeks 1 through 4  0.25 mg once weekly  Weeks 5 through 8  0.5 mg once weekly  Weeks 9 though 12  1 mg once weekly  Weeks 13 through 16  1.7 mg once weekly   Semaglutide Injection (Weight Management) What is this medication? SEMAGLUTIDE (SEM a GLOO tide) promotes weight loss. It may also be used to maintain weight loss. It works by decreasing appetite. Changes to diet and exercise are often combined with this medication. This medicine may be used for other purposes; ask your health care provider or pharmacist if you have questions. COMMON BRAND NAME(S): [id number] What should I tell my care team before I take this medication? They need to know if you have any of these conditions: Endocrine tumors (MEN 2) or if someone in your family had these tumors Eye disease, vision problems Gallbladder disease History of depression or mental health  disease History of pancreatitis Kidney disease Stomach or intestine problems Suicidal thoughts, plans, or attempt; a previous suicide attempt by you or a family member Thyroid cancer or if someone in your family had thyroid cancer An unusual or allergic reaction to semaglutide, other medications, foods, dyes, or preservatives Pregnant or trying to get pregnant Breast-feeding How should I use this medication? This medication is injected under the skin. You will be taught how to prepare and give it. Take it as directed on the prescription label. It is given once every week (every 7 days). Keep taking it unless your care team tells you to stop. It is important that you put your used needles and pens in a special sharps container. Do not put them in a trash can. If you do not have a sharps container, call your pharmacist or care team to get one. A special MedGuide will be given to you by the pharmacist with each prescription and refill. Be sure to read this information carefully each time. This medication comes with INSTRUCTIONS FOR USE. Ask your pharmacist for directions on how to use this medication. Read the information carefully. Talk to your pharmacist or care team if you have questions. Talk to your care team about the use of this medication in children. While it may be prescribed for children as young as 12 years for selected conditions, precautions do apply. Overdosage: If you think you have taken too much of this medicine contact a  poison control center or emergency room at once. NOTE: This medicine is only for you. Do not share this medicine with others. What if I miss a dose? If you miss a dose and the next scheduled dose is more than 2 days away, take the missed dose as soon as possible. If you miss a dose and the next scheduled dose is less than 2 days away, do not take the missed dose. Take the next dose at your regular time. Do not take double or extra doses. If you miss your dose for 2  weeks or more, take the next dose at your regular time or call your care team to talk about how to restart this medication. What may interact with this medication? Insulin and other medications for diabetes This list may not describe all possible interactions. Give your health care provider a list of all the medicines, herbs, non-prescription drugs, or dietary supplements you use. Also tell them if you smoke, drink alcohol, or use illegal drugs. Some items may interact with your medicine. What should I watch for while using this medication? Visit your care team for regular checks on your progress. It may be some time before you see the benefit from this medication. Drink plenty of fluids while taking this medication. Check with your care team if you have severe diarrhea, nausea, and vomiting, or if you sweat a lot. The loss of too much body fluid may make it dangerous for you to take this medication. This medication may affect blood sugar levels. Ask your care team if changes in diet or medications are needed if you have diabetes. If you or your family notice any changes in your behavior, such as new or worsening depression, thoughts of harming yourself, anxiety, other unusual or disturbing thoughts, or memory loss, call your care team right away. Women should inform their care team if they wish to become pregnant or think they might be pregnant. Losing weight while pregnant is not advised and may cause harm to the unborn child. Talk to your care team for more information. What side effects may I notice from receiving this medication? Side effects that you should report to your care team as soon as possible: Allergic reactions--skin rash, itching, hives, swelling of the face, lips, tongue, or throat Change in vision Dehydration--increased thirst, dry mouth, feeling faint or lightheaded, headache, dark yellow or brown urine Gallbladder problems--severe stomach pain, nausea, vomiting, fever Heart  palpitations--rapid, pounding, or irregular heartbeat Kidney injury--decrease in the amount of urine, swelling of the ankles, hands, or feet Pancreatitis--severe stomach pain that spreads to your back or gets worse after eating or when touched, fever, nausea, vomiting Thoughts of suicide or self-harm, worsening mood, feelings of depression Thyroid cancer--new mass or lump in the neck, pain or trouble swallowing, trouble breathing, hoarseness Side effects that usually do not require medical attention (report to your care team if they continue or are bothersome): Diarrhea Loss of appetite Nausea Stomach pain Vomiting This list may not describe all possible side effects. Call your doctor for medical advice about side effects. You may report side effects to FDA at 1-800-FDA-1088. Where should I keep my medication? Keep out of the reach of children and pets. Refrigeration (preferred): Store in the refrigerator. Do not freeze. Keep this medication in the original container until you are ready to take it. Get rid of any unused medication after the expiration date. Room temperature: If needed, prior to cap removal, the pen can be stored at room temperature   for up to 28 days. Protect from light. If it is stored at room temperature, get rid of any unused medication after 28 days or after it expires, whichever is first. It is important to get rid of the medication as soon as you no longer need it or it is expired. You can do this in two ways: Take the medication to a medication take-back program. Check with your pharmacy or law enforcement to find a location. If you cannot return the medication, follow the directions in the [place]. NOTE: This sheet is a summary. It may not cover all possible information. If you have questions about this medicine, talk to your doctor, pharmacist, or health care provider.  2023 Elsevier/Gold Standard (2020-04-20 00:00:00)  Please download Myfitness Pal App ( free). You  may log every thing you eat for even 2-3 days to get a better of idea of true daily calories. To loose weight, you have to use more calories than than consumed and essentially create caloric deficit to loose weight. The goal is 1-2 lbs per week of weight loss.   You review below from [employer].   https://www.health.PrankSearch.co.uk  Calorie counting made easy  Eat less, exercise more. If only it were that simple! As most dieters know, losing weight can be very challenging. As this report details, a range of influences can affect how people gain and lose weight. But a basic understanding of how to tip your energy balance in favor of weight loss is a good place to start.  Start by determining how many calories you should consume each day. To do so, you need to know how many calories you need to maintain your current weight. Doing this requires a few simple calculations.  First, multiply your current weight by 15 -- that's roughly the number of calories per pound of body weight needed to maintain your current weight if you are moderately active. Moderately active means getting at least 30 minutes of physical activity a day in the form of exercise (walking at a brisk pace, climbing stairs, or active gardening). Let's say you're a woman who is 5 feet, 4 inches tall and weighs 155 pounds, and you need to lose about 15 pounds to put you in a healthy weight range. If you multiply 155 by 15, you will get 2,325, which is the number of calories per day that you need in order to maintain your current weight (weight-maintenance calories). To lose weight, you will need to get below that total.  For example, to lose 1 to 2 pounds a week -- a rate that experts consider safe -- your food consumption should provide 500 to 1,000 calories less than your total weight-maintenance calories. If you need 2,325 calories a day to maintain your current weight, reduce your daily calories  to between 1,325 and 1,825. If you are sedentary, you will also need to build more activity into your day. In order to lose at least a pound a week, try to do at least 30 minutes of physical activity on most days, and reduce your daily calorie intake by at least 500 calories. However, calorie intake should not fall below 1,200 a day in women or 1,500 a day in men, except under the supervision of a health professional. Eating too few calories can endanger your health by depriving you of needed nutrients.  Meeting your calorie target How can you meet your daily calorie target? One approach is to add up the number of calories per serving of all  the foods that you eat, and then plan your menus accordingly. You can buy books that list calories per serving for many foods. In addition, the nutrition labels on all packaged foods and beverages provide calories per serving information. Make a point of reading the labels of the foods and drinks you use, noting the number of calories and the serving sizes. Many recipes published in cookbooks, newspapers, and magazines provide similar information.  If you hate counting calories, a different approach is to restrict how much and how often you eat, and to eat meals that are low in calories. Dietary guidelines issued by the American Heart Association stress common sense in choosing your foods rather than focusing strictly on numbers, such as total calories or calories from fat. Whichever method you choose, research shows that a regular eating schedule -- with meals and snacks planned for certain times each day -- makes for the most successful approach. The same applies after you have lost weight and want to keep it off. Sticking with an eating schedule increases your chance of maintaining your new weight.    This is  Dr. Lupita Dawn  ( an amazing physician in my office!)  example of a  "Low GI"  Diet:  It will allow you to lose 4 to 8  lbs  per month if you follow it carefully.   Your goal with exercise is a minimum of 30 minutes of aerobic exercise 5 days per week (Walking does not count once it becomes easy!)    All of the foods can be found at grocery stores and in bulk at [employer].  The Atkins protein bars and shakes are available in more varieties at [employer], [employer] and [place].     7 AM Breakfast:  Choose from the following:  Low carbohydrate Protein  Shakes (I recommend the  Premier Protein chocolate shakes,  EAS AdvantEdge "Carb Control" shakes  Or the Atkins shakes all are under 3 net carbs)     a scrambled egg/bacon/cheese burrito made with [employer]'s "carb balance" whole wheat tortilla  (about 10 net carbs )  Regulatory affairs officer (basically a quiche without the pastry crust) that is eaten cold and very convenient way to get your eggs.  8 carbs)  If you make your own protein shakes, avoid bananas and pineapple,  And use low carb greek yogurt or original /unsweetened almond or soy milk    Avoid cereal and bananas, oatmeal and cream of wheat and grits. They are loaded with carbohydrates!   10 AM: high protein snack:  Protein bar by Atkins (the snack size, under 200 cal, usually < 6 net carbs).    A stick of cheese:  Around 1 carb,  100 cal     Dannon Light n Fit [place] Yogurt  (80 cal, 8 carbs)  Other so called "protein bars" and Greek yogurts tend to be loaded with carbohydrates.  Remember, in food advertising, the word "energy" is synonymous for " carbohydrate."  Lunch:   A Sandwich using the bread choices listed, Can use any  Eggs,  lunchmeat, grilled meat or canned tuna), avocado, regular mayo/mustard  and cheese.  A Salad using blue cheese, ranch,  Goddess or vinagrette,  Avoid taco shells, croutons or "confetti" and no "candied nuts" but regular nuts OK.   No pretzels, nabs  or chips.  Pickles and miniature sweet peppers are a good low carb alternative that provide a "crunch"  The bread is the only source  of carbohydrate in a  sandwich and  can be decreased by trying some of the attached alternatives to traditional loaf bread   Avoid "Low fat dressings, as well as [place] dressings They are loaded with sugar!   3 PM/ Mid day  Snack:  Consider  1 ounce of  almonds, walnuts, pistachios, pecans, peanuts,  Macadamia nuts or a nut medley.  Avoid "granola and granola bars "  Mixed nuts are ok in moderation as long as there are no raisins,  cranberries or dried fruit.   KIND bars are OK if you get the low glycemic index variety   Try the prosciutto/mozzarella cheese sticks by [employer]  In deli /backery section   High protein      6 PM  Dinner:     Meat/fowl/fish with a green salad, and either broccoli, cauliflower, green beans, spinach, brussel sprouts or  Lima beans. DO NOT BREAD THE PROTEIN!!      There is a low carb pasta by Dreamfield's that is acceptable and tastes great: only 5 digestible carbs/serving.( All grocery stores but BJs carry it ) Several ready made meals are available low carb:   Try Michel Angelo's chicken piccata or chicken or eggplant parm over low carb pasta.(Lowes and BJs)   Marjory Lies Sanchez's "Carnitas" (pulled pork, no sauce,  0 carbs) or his beef pot roast to make a dinner burrito (at [employer]J's)  Pesto over low carb pasta (bj's sells a good quality pesto in the center refrigerated section of the deli   Try satueeing  Cheral Marker with mushroooms as a good side   [employer] makes a mashed cauliflower that tastes like mashed potatoes  Whole wheat pasta is still full of digestible carbs and  Not as low in glycemic index as Dreamfield's.   Brown rice is still rice,  So skip the rice and noodles if you eat [place] or [place] (or at least limit to 1/2 cup)  9 PM snack :   Breyer's "low carb" fudgsicle or  ice cream bar (Carb Smart line), or  Weight Watcher's ice cream bar , or another "no sugar added" ice cream;  a serving of fresh berries/cherries with whipped cream   Cheese or DANNON'S  LlGHT N FIT GREEK YOGURT  8 ounces of [place] unsweetened almond/cococunut milk    Treat yourself to a parfait made with whipped cream blueberiies, walnuts and vanilla greek yogurt  Avoid bananas, pineapple, grapes  and watermelon on a regular basis because they are high in sugar.  THINK OF THEM AS DESSERT  Remember that snack Substitutions should be less than 10 NET carbs per serving and meals < 20 carbs. Remember to subtract fiber grams to get the "net carbs."  Health Maintenance, Male Adopting a healthy lifestyle and getting preventive care are important in promoting health and wellness. Ask your health care provider about: The right schedule for you to have regular tests and exams. Things you can do on your own to prevent diseases and keep yourself healthy. What should I know about diet, weight, and exercise? Eat a healthy diet  Eat a diet that includes plenty of vegetables, fruits, low-fat dairy products, and lean protein. Do not eat a lot of foods that are high in solid fats, added sugars, or sodium. Maintain a healthy weight Body mass index (BMI) is a measurement that can be used to identify possible weight problems. It estimates body fat based on height and weight. Your health care provider can  help determine your BMI and help you achieve or maintain a healthy weight. Get regular exercise Get regular exercise. This is one of the most important things you can do for your health. Most adults should: Exercise for at least 150 minutes each week. The exercise should increase your heart rate and make you sweat (moderate-intensity exercise). Do strengthening exercises at least twice a week. This is in addition to the moderate-intensity exercise. Spend less time sitting. Even light physical activity can be beneficial. Watch cholesterol and blood lipids Have your blood tested for lipids and cholesterol at 47 years of age, then have this test every 5 years. You may need to have your  cholesterol levels checked more often if: Your lipid or cholesterol levels are high. You are older than 47 years of age. You are at high risk for heart disease. What should I know about cancer screening? Many types of cancers can be detected early and may often be prevented. Depending on your health history and family history, you may need to have cancer screening at various ages. This may include screening for: Colorectal cancer. Prostate cancer. Skin cancer. Lung cancer. What should I know about heart disease, diabetes, and high blood pressure? Blood pressure and heart disease High blood pressure causes heart disease and increases the risk of stroke. This is more likely to develop in people who have high blood pressure readings or are overweight. Talk with your health care provider about your target blood pressure readings. Have your blood pressure checked: Every 3-5 years if you are 41-37 years of age. Every year if you are 86 years old or older. If you are between the ages of 34 and 77 and are a current or former smoker, ask your health care provider if you should have a one-time screening for abdominal aortic aneurysm (AAA). Diabetes Have regular diabetes screenings. This checks your fasting blood sugar level. Have the screening done: Once every three years after age 71 if you are at a normal weight and have a low risk for diabetes. More often and at a younger age if you are overweight or have a high risk for diabetes. What should I know about preventing infection? Hepatitis B If you have a higher risk for hepatitis B, you should be screened for this virus. Talk with your health care provider to find out if you are at risk for hepatitis B infection. Hepatitis C Blood testing is recommended for: Everyone born from [birth year] through 1965. Anyone with known risk factors for hepatitis C. Sexually transmitted infections (STIs) You should be screened each year for STIs, including gonorrhea  and chlamydia, if: You are sexually active and are younger than 47 years of age. You are older than 47 years of age and your health care provider tells you that you are at risk for this type of infection. Your sexual activity has changed since you were last screened, and you are at increased risk for chlamydia or gonorrhea. Ask your health care provider if you are at risk. Ask your health care provider about whether you are at high risk for HIV. Your health care provider may recommend a prescription medicine to help prevent HIV infection. If you choose to take medicine to prevent HIV, you should first get tested for HIV. You should then be tested every 3 months for as long as you are taking the medicine. Follow these instructions at home: Alcohol use Do not drink alcohol if your health care provider tells you not to drink. If  you drink alcohol: Limit how much you have to 0-2 drinks a day. Know how much alcohol is in your drink. In the [place] one drink equals one 12 oz bottle of beer (355 mL), one 5 oz glass of wine (148 mL), or one 1 oz glass of hard liquor (44 mL). Lifestyle Do not use any products that contain nicotine or tobacco. These products include cigarettes, chewing tobacco, and vaping devices, such as e-cigarettes. If you need help quitting, ask your health care provider. Do not use street drugs. Do not share needles. Ask your health care provider for help if you need support or information about quitting drugs. General instructions Schedule regular health, dental, and eye exams. Stay current with your vaccines. Tell your health care provider if: You often feel depressed. You have ever been abused or do not feel safe at home. Summary Adopting a healthy lifestyle and getting preventive care are important in promoting health and wellness. Follow your health care provider's instructions about healthy diet, exercising, and getting tested or screened for diseases. Follow your health  care provider's instructions on monitoring your cholesterol and blood pressure. This information is not intended to replace advice given to you by your health care provider. Make sure you discuss any questions you have with your health care provider. Document Revised: 06/26/2020 Document Reviewed: 06/26/2020 Elsevier Patient Education  [place].

## 2022-05-14 NOTE — Assessment & Plan Note (Signed)
Screening labs performed at work.  These will be scanned into the chart.  Counseled patient on exercise and how to use exercise to maintain weight and lose weight.

## 2022-05-16 ENCOUNTER — Encounter: Payer: Self-pay | Admitting: Family

## 2022-05-21 ENCOUNTER — Encounter: Payer: Self-pay | Admitting: Family

## 2022-05-28 ENCOUNTER — Other Ambulatory Visit (HOSPITAL_COMMUNITY): Payer: Self-pay

## 2022-05-28 NOTE — Telephone Encounter (Signed)
Spoke to pt and he stated that he would reach out to his insurance company and see what they recommended that they they would pay for.

## 2022-06-03 ENCOUNTER — Encounter: Payer: Self-pay | Admitting: Family

## 2022-06-03 NOTE — Telephone Encounter (Signed)
PA for Wegovy is needed.  

## 2022-06-05 ENCOUNTER — Other Ambulatory Visit (HOSPITAL_COMMUNITY): Payer: Self-pay

## 2022-06-05 ENCOUNTER — Telehealth: Payer: Self-pay

## 2022-06-05 NOTE — Telephone Encounter (Signed)
Pharmacy Patient Advocate Encounter   Received notification from [facility] that prior authorization for [facility] 0.25MG /0.5ML auto-injectors is required/requested.  Per Test Claim: Product/service not covered   PA submitted on 06/05/22 to (ins) [employer] via [employer] or [facility]) confirmation # [id number] Status is pending

## 2022-06-07 NOTE — Telephone Encounter (Signed)
Pt is aware sent via my chart as well

## 2022-06-07 NOTE — Telephone Encounter (Signed)
Pharmacy Patient Advocate Encounter  Received notification from [facility] that the request for prior authorization for Wegovy 0.25mg /0.77ml has been denied due to this health benefit plan does not cover the following services, supplies, drugs or charges: any treatment or regimen, medical or surgical, for the purpose of reducing or controlling the weight of the member, or for the treatment of obesity except for surgical treatment of morbid obesity, or as specifically covered by this health benefit plan.    Please be advised we currently do not have a Pharmacist to review denials, therefore you will need to process appeals accordingly as needed. Thanks for your support at this time.

## 2022-06-29 ENCOUNTER — Telehealth: Payer: Self-pay

## 2022-06-29 NOTE — Telephone Encounter (Signed)
Prior Auth for [facility] sent to plan  Monish Willner (Key: [id number]) Rx #: [id number]  AVWUJW 0.25MG /0.5ML auto-injectors Form Valinda Hoar [place] Commercial Electronic Request Form

## 2022-08-08 ENCOUNTER — Telehealth: Payer: Self-pay

## 2022-08-08 NOTE — Telephone Encounter (Signed)
Patient would like to have labs placed to have drawn next week before he comes in office. Patient would like ordered to [facility]. Patient aware that you are out of office until Monday.

## 2022-08-09 ENCOUNTER — Other Ambulatory Visit: Payer: Self-pay | Admitting: Internal Medicine

## 2022-08-09 DIAGNOSIS — E890 Postprocedural hypothyroidism: Secondary | ICD-10-CM

## 2022-08-09 DIAGNOSIS — E559 Vitamin D deficiency, unspecified: Secondary | ICD-10-CM

## 2022-08-09 NOTE — Telephone Encounter (Signed)
Please schedule lab appt

## 2022-08-09 NOTE — Telephone Encounter (Signed)
Please release lab orders

## 2022-08-12 ENCOUNTER — Other Ambulatory Visit: Payer: Self-pay

## 2022-08-12 DIAGNOSIS — E890 Postprocedural hypothyroidism: Secondary | ICD-10-CM

## 2022-08-12 DIAGNOSIS — E559 Vitamin D deficiency, unspecified: Secondary | ICD-10-CM

## 2022-08-13 LAB — T4, FREE: Free T4: 1.37 ng/dL (ref 0.82–1.77)

## 2022-08-13 LAB — TSH: TSH: 2.47 u[IU]/mL (ref 0.450–4.500)

## 2022-08-13 LAB — VITAMIN D 25 HYDROXY (VIT D DEFICIENCY, FRACTURES): Vit D, 25-Hydroxy: 65.8 ng/mL (ref 30.0–100.0)

## 2022-08-16 ENCOUNTER — Ambulatory Visit: Payer: BC Managed Care – PPO | Admitting: Internal Medicine

## 2022-08-16 ENCOUNTER — Encounter: Payer: Self-pay | Admitting: Internal Medicine

## 2022-08-16 VITALS — BP 120/80 | HR 56 | Ht 66.0 in | Wt 239.0 lb

## 2022-08-16 DIAGNOSIS — E559 Vitamin D deficiency, unspecified: Secondary | ICD-10-CM

## 2022-08-16 DIAGNOSIS — R600 Localized edema: Secondary | ICD-10-CM

## 2022-08-16 DIAGNOSIS — E890 Postprocedural hypothyroidism: Secondary | ICD-10-CM | POA: Diagnosis not present

## 2022-08-16 MED ORDER — LEVOTHYROXINE SODIUM 75 MCG PO TABS: 75.0000 ug | ORAL_TABLET | Freq: Every day | ORAL | 3 refills | Status: DC

## 2022-08-16 MED ORDER — VITAMIN D (ERGOCALCIFEROL) 1.25 MG (50000 UNIT) PO CAPS: 50000.0000 [IU] | ORAL_CAPSULE | ORAL | 3 refills | Status: DC

## 2022-08-16 NOTE — Patient Instructions (Signed)
Ergocalciferol 50,000 international unit  every other week   You are on levothyroxine - which is your thyroid hormone supplement. You MUST take this consistently.  You should take this first thing in the morning on an empty stomach with water. You should not take it with other medications. Wait to 1hr prior to eating. If you are taking any vitamins - please take these in the evening.   If you miss a dose, please take your missed dose the following day (double the dose for that day). You should have a pill box for ONLY levothyroxine on your bedside table to help you remember to take your medications.

## 2022-08-16 NOTE — Progress Notes (Signed)
`  Name: Gary Pace  MRN/ DOB: [id number], May 29, 1975    Age/ Sex: 47 y.o., male     PCP: Allegra Grana, FNP   Reason for Endocrinology Evaluation: Left thyroid nodule     Initial Endocrinology Clinic Visit: 09/11/2020    PATIENT IDENTIFIER: [name] is a 47 y.o., male with a past medical history of Prediabetes, and GERD. He has followed with [facility] since 09/11/2020 for consultative assistance with management of his left thyroid nodule .   HISTORICAL SUMMARY:  He has been noted with thyromegaly during physical exam which prompted a thyroid ultrasound demonstrating a 5.1 cm left thyroid nodule, meeting FNA criteria. 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   Mother and brother with thyroid disease    SUBJECTIVE:     Today (08/16/2022):  Gary Pace is here for a follow up on postoperative hypothyroidism  S/P left lobectomy with benign pathology 03/09/2021 Has noted weight loss  Continues with mild fatigue  Denies constipation or diarrhea  Denies palpitations  Has occasional  neck symptoms ( catching )  Levothyroxine 75 mcg daily  Ergocalciferol 50,000 iu weekly     HISTORY:  Past Medical History:  Past Medical History:  Diagnosis Date   Cardiomyopathy (HCC)    COVID-19 03/08/2019   Dyspnea    Post Covid   Enlarged prostate    Followed by Dr. Achilles Dunk   GERD (gastroesophageal reflux disease)    Past Surgical History:  Past Surgical History:  Procedure Laterality Date   CARPAL TUNNEL RELEASE Right    [facility]   COLONOSCOPY WITH PROPOFOL N/A 12/14/2020   Procedure: COLONOSCOPY WITH PROPOFOL;  Surgeon: Toney Reil, MD;  Location: [facility];  Service: Endoscopy;  Laterality: N/A;   THYROID LOBECTOMY Left 03/09/2021   Procedure: LEFT THYROID LOBECTOMY;  Surgeon: Darnell Level, MD;  Location: [facility];  Service: General;   Laterality: Left;   TONSILLECTOMY AND ADENOIDECTOMY  2008   Social History:  reports that he has never smoked. He has never used smokeless tobacco. He reports that he does not drink alcohol and does not use drugs. Family History:  Family History  Problem Relation Age of Onset   Thyroid disease Brother    Cancer Paternal Grandfather 42       prostate cancer   Stroke Paternal Grandfather    Diabetes Paternal Grandfather    Other Paternal Grandfather    Heart attack Paternal Grandfather    Cancer Paternal Aunt        breast, brain   Other Mother        unknown medical history   Other Father        unknown medical history   AAA (abdominal aortic aneurysm) Paternal Aunt    Thyroid cancer Neg Hx      HOME MEDICATIONS: Allergies as of 08/16/2022   Not on File      Medication List        Accurate as of August 16, 2022 12:57 PM. If you have any questions, ask your nurse or doctor.          ketotifen 0.025 % ophthalmic solution Commonly known as: ZADITOR Place 1 drop into both eyes 2 (two) times daily as needed (allergies).   levothyroxine 75 MCG tablet Commonly known as: SYNTHROID Take 1 tablet (75 mcg total) by mouth daily.   pantoprazole  20 MG tablet Commonly known as: PROTONIX Take 20 mg by mouth daily.   Vitamin D (Ergocalciferol) 1.25 MG (50000 UNIT) Caps capsule Commonly known as: DRISDOL TAKE 1 CAPSULE BY MOUTH EVERY 7 DAYS   Wegovy 0.25 MG/0.5ML Soaj Generic drug: Semaglutide-Weight Management Inject 0.25 mg into the skin once a week.          OBJECTIVE:   PHYSICAL EXAM: VS: There were no vitals taken for this visit.   EXAM: General: Pt appears well and is in NAD  Neck: General: Supple without adenopathy. Thyroid: no nodules or goiter appreciated   Lungs: Clear with good BS bilat with no rales, rhonchi, or wheezes  Heart: Auscultation: RRR.  Abdomen: Normoactive bowel sounds, soft, nontender, without masses or organomegaly palpable   Extremities:  BL LE: No pretibial edema normal ROM and strength.  Mental Status: Judgment, insight: Intact Orientation: Oriented to time, place, and person Mood and affect: No depression, anxiety, or agitation     DATA REVIEWED:   Latest Reference Range & Units 08/12/22 09:43  Vitamin D, 25-Hydroxy 30.0 - 100.0 ng/mL 65.8    Latest Reference Range & Units 08/12/22 09:43  TSH 0.450 - 4.500 uIU/mL 2.470  T4,Free(Direct) 0.82 - 1.77 ng/dL 8.29    Pathology report 03/09/2021   Clinical History: Left thyroid nodule (crm)      FINAL MICROSCOPIC DIAGNOSIS:   A. THYROID, LEFT, LOBECTOMY (31.0 G):  Multinodular goiter with dominant nodule  Negative for carcinoma     FNA left thyroid nodule 09/20/2020  A. THYROID, LEFT; ULTRASOUND-GUIDED FINE NEEDLE ASPIRATION:  - BENIGN (BETHESDA DIAGNOSTIC CATEGORY II).  - BENIGN FOLLICULAR EPITHELIAL CELLS AND COLLOID.   ASSESSMENT / PLAN / RECOMMENDATIONS:   Left thyroid Nodule , S/P Left Lobectomy    - S/P left lobectomy with benign pathology  -TSH normal, no change  Medication  Continue levothyroxine 75 mcg daily     2.  Vitamin D deficiency  - Was 16.9 but this has resolved , pt would like to continue ergocalciferol as his compliance improves with once weekly rather than daily -Given that his vitamin D is at the upper limit of normal, I have asked him to switch ergocalciferol to every other week   Change ergocalciferol 50,000 IU every other week   3.  Lower extremity edema:   -I have counseled the patient on leg elevation and decreasing salt intake   F/U in 1 yr  Signed electronically by: Lyndle Herrlich, MD  [facility] End[facility]  [facility] [address] [place], [place] [address] Phone: [phone] FAX: [phone]      CC: Allegra Grana, FNP [address] [place] [address] Phone: [phone]  Fax: [phone]   Return to Endocrinology  clinic as below: Future Appointments  Date Time Provider Department Center  08/16/2022  3:00 PM Saveah Bahar, Konrad Dolores, MD [facility] None  05/16/2023  3:00 PM Arnett, Lyn Records, FNP [facility]

## 2022-09-30 ENCOUNTER — Encounter: Payer: Self-pay | Admitting: Family

## 2022-09-30 NOTE — Telephone Encounter (Signed)
Spoke to pt and scheduled appt for 10/01/22

## 2022-10-01 ENCOUNTER — Encounter: Payer: Self-pay | Admitting: Family

## 2022-10-01 ENCOUNTER — Ambulatory Visit: Payer: BC Managed Care – PPO | Admitting: Family

## 2022-10-01 VITALS — BP 118/80 | HR 56 | Temp 97.9°F | Ht 66.0 in | Wt 234.2 lb

## 2022-10-01 DIAGNOSIS — Z23 Encounter for immunization: Secondary | ICD-10-CM

## 2022-10-01 DIAGNOSIS — L0291 Cutaneous abscess, unspecified: Secondary | ICD-10-CM | POA: Diagnosis not present

## 2022-10-01 DIAGNOSIS — R7303 Prediabetes: Secondary | ICD-10-CM

## 2022-10-01 MED ORDER — CEPHALEXIN 500 MG PO CAPS: 500.0000 mg | ORAL_CAPSULE | Freq: Four times a day (QID) | ORAL | 0 refills | Status: DC

## 2022-10-01 NOTE — Patient Instructions (Addendum)
Please start using warm compresses bilateral groin to see if you can encourage discharge to come to the surface.  I have also sent an antibiotic called Keflex.  Please ensure that you are on probiotics  As discussed, Dial soap would be helpful to prevent recurrence.  Please download Myfitness Pal App ( basic version is free).   You may log every thing you eat for even 2-3 days to get a better of idea of total daily calories. To loose weight, we have to create caloric deficit to loose weight. The goal is 1-2 lbs per week of weight loss.   Excellent article below from [facility].   https://www.health.CriticalZ.it  Calorie counting made easy  Eat less, exercise more. If only it were that simple! As most dieters know, losing weight can be very challenging. As this report details, a range of influences can affect how people gain and lose weight. But a basic understanding of how to tip your energy balance in favor of weight loss is a good place to start.  Start by determining how many calories you should consume each day. To do so, you need to know how many calories you need to maintain your current weight. Doing this requires a few simple calculations.  First, multiply your current weight by 15 -- that's roughly the number of calories per pound of body weight needed to maintain your current weight if you are moderately active. Moderately active means getting at least 30 minutes of physical activity a day in the form of exercise (walking at a brisk pace, climbing stairs, or active gardening). Let's say you're a woman who is 5 feet, 4 inches tall and weighs 155 pounds, and you need to lose about 15 pounds to put you in a healthy weight range. If you multiply 155 by 15, you will get 2,325, which is the number of calories per day that you need in order to maintain your current weight (weight-maintenance calories). To lose weight, you will need to get below that  total.  For example, to lose 1 to 2 pounds a week -- a rate that experts consider safe -- your food consumption should provide 500 to 1,000 calories less than your total weight-maintenance calories. If you need 2,325 calories a day to maintain your current weight, reduce your daily calories to between 1,325 and 1,825. If you are sedentary, you will also need to build more activity into your day. In order to lose at least a pound a week, try to do at least 30 minutes of physical activity on most days, and reduce your daily calorie intake by at least 500 calories. However, calorie intake should not fall below 1,200 a day in women or 1,500 a day in men, except under the supervision of a health professional. Eating too few calories can endanger your health by depriving you of needed nutrients.  Meeting your calorie target How can you meet your daily calorie target? One approach is to add up the number of calories per serving of all the foods that you eat, and then plan your menus accordingly. You can buy books that list calories per serving for many foods. In addition, the nutrition labels on all packaged foods and beverages provide calories per serving information. Make a point of reading the labels of the foods and drinks you use, noting the number of calories and the serving sizes. Many recipes published in cookbooks, newspapers, and magazines provide similar information.  If you hate counting calories, a different approach  is to restrict how much and how often you eat, and to eat meals that are low in calories. Dietary guidelines issued by the American Heart Association stress common sense in choosing your foods rather than focusing strictly on numbers, such as total calories or calories from fat. Whichever method you choose, research shows that a regular eating schedule -- with meals and snacks planned for certain times each day -- makes for the most successful approach. The same applies after you have lost  weight and want to keep it off. Sticking with an eating schedule increases your chance of maintaining your new weight.    This is  Dr. Melina Schools  ( an amazing physician in my office!)  example of a  "Low GI"  Diet:  It will allow you to lose 4 to 8  lbs  per month if you follow it carefully.  Your goal with exercise is a minimum of 30 minutes of aerobic exercise 5 days per week (Walking does not count once it becomes easy!)    All of the foods can be found at grocery stores and in bulk at [employer].  The Atkins protein bars and shakes are available in more varieties at Target, WalMart and Lowe's Foods.     7 AM Breakfast:  Choose from the following:  Low carbohydrate Protein  Shakes (I recommend the  Premier Protein chocolate shakes,  EAS AdvantEdge "Carb Control" shakes  Or the Atkins shakes all are under 3 net carbs)     a scrambled egg/bacon/cheese burrito made with Mission's "carb balance" whole wheat tortilla  (about 10 net carbs )  Medical laboratory scientific officer (basically a quiche without the pastry crust) that is eaten cold and very convenient way to get your eggs.  8 carbs)  If you make your own protein shakes, avoid bananas and pineapple,  And use low carb greek yogurt or original /unsweetened almond or soy milk    Avoid cereal and bananas, oatmeal and cream of wheat and grits. They are loaded with carbohydrates!   10 AM: high protein snack:  Protein bar by Atkins (the snack size, under 200 cal, usually < 6 net carbs).    A stick of cheese:  Around 1 carb,  100 cal     Dannon Light n Fit [place] Yogurt  (80 cal, 8 carbs)  Other so called "protein bars" and Greek yogurts tend to be loaded with carbohydrates.  Remember, in food advertising, the word "energy" is synonymous for " carbohydrate."  Lunch:   A Sandwich using the bread choices listed, Can use any  Eggs,  lunchmeat, grilled meat or canned tuna), avocado, regular mayo/mustard  and cheese.  A Salad using blue cheese,  ranch,  Goddess or vinagrette,  Avoid taco shells, croutons or "confetti" and no "candied nuts" but regular nuts OK.   No pretzels, nabs  or chips.  Pickles and miniature sweet peppers are a good low carb alternative that provide a "crunch"  The bread is the only source of carbohydrate in a sandwich and  can be decreased by trying some of the attached alternatives to traditional loaf bread   Avoid "Low fat dressings, as well as Reyne Dumas and Smith[employer] Foods dressings They are loaded with sugar!   3 PM/ Mid day  Snack:  Consider  1 ounce of  almonds, walnuts, pistachios, pecans, peanuts,  Macadamia nuts or a nut medley.  Avoid "granola and granola bars "  Mixed nuts are ok in moderation  as long as there are no raisins,  cranberries or dried fruit.   KIND bars are OK if you get the low glycemic index variety   Try the prosciutto/mozzarella cheese sticks by Fiorruci  In deli /backery section   High protein      6 PM  Dinner:     Meat/fowl/fish with a green salad, and either broccoli, cauliflower, green beans, spinach, brussel sprouts or  Lima beans. DO NOT BREAD THE PROTEIN!!      There is a low carb pasta by Dreamfield's that is acceptable and tastes great: only 5 digestible carbs/serving.( All grocery stores but BJs carry it ) Several ready made meals are available low carb:   Try Michel Angelo's chicken piccata or chicken or eggplant parm over low carb pasta.(Lowes and BJs)   Clifton Custard Sanchez's "Carnitas" (pulled pork, no sauce,  0 carbs) or his beef pot roast to make a dinner burrito (at BJ's)  Pesto over low carb pasta (bj's sells a good quality pesto in the center refrigerated section of the deli   Try satueeing  Roosvelt Harps with mushroooms as a good side   Green Giant makes a mashed cauliflower that tastes like mashed potatoes  Whole wheat pasta is still full of digestible carbs and  Not as low in glycemic index as Dreamfield's.   Brown rice is still rice,  So skip the rice and noodles if you  eat [place] or [place] (or at least limit to 1/2 cup)  9 PM snack :   Breyer's "low carb" fudgsicle or  ice cream bar (Carb Smart line), or  Weight Watcher's ice cream bar , or another "no sugar added" ice cream;  a serving of fresh berries/cherries with whipped cream   Cheese or DANNON'S LlGHT N FIT GREEK YOGURT  8 ounces of [place] Diamond unsweetened almond/cococunut milk    Treat yourself to a parfait made with whipped cream blueberiies, walnuts and vanilla greek yogurt  Avoid bananas, pineapple, grapes  and watermelon on a regular basis because they are high in sugar.  THINK OF THEM AS DESSERT  Remember that snack Substitutions should be less than 10 NET carbs per serving and meals < 20 carbs. Remember to subtract fiber grams to get the "net carbs."

## 2022-10-01 NOTE — Assessment & Plan Note (Signed)
No systemic features. Neither abscess is fluctuant and I discussed with patient treatment with oral antibiotics to cover for MSSA versus incision and drainage at this time.  Advised warm compresses and after resolved, Dial soap

## 2022-10-01 NOTE — Assessment & Plan Note (Signed)
Counseled on creating caloric deficit, low glycemic diet.

## 2022-10-01 NOTE — Progress Notes (Signed)
Assessment & Plan:  Abscess Assessment & Plan: No systemic features. Neither abscess is fluctuant and I discussed with patient treatment with oral antibiotics to cover for MSSA versus incision and drainage at this time.  Advised warm compresses and after resolved, Dial soap  Orders: -     Cephalexin; Take 1 capsule (500 mg total) by mouth every 6 (six) hours.  Dispense: 28 capsule; Refill: 0  Immunization due -     Tdap vaccine greater than or equal to 47yo IM  Prediabetes Assessment & Plan: Counseled on creating caloric deficit, low glycemic diet.      Return precautions given.   Risks, benefits, and alternatives of the medications and treatment plan prescribed today were discussed, and patient expressed understanding.   Education regarding symptom management and diagnosis given to patient on AVS either electronically or printed.  No follow-ups on file.  Rennie Plowman, FNP  Subjective:    Patient ID: Gary Pace, male    DOB: 11-16-1975, 47 y.o.   MRN: [id number]  CC: Gary Pace is a 47 y.o. male who presents today for an acute visit.    HPI: Complains of boil in bilateral groins , x 1 weeks, waxes and wanes.   He was able to express some purulent discharge.    No fever.   No h/o MRSA.  He poked his finger with a nail yesterday. Last Tdap , 2017.  Insurance didn't approve wegovy.  Breaktfast is [employer], lunch is usually from vending machine, fast food such as mcdonalds, and dinner varies , example of chicken and rice. No soda, sweet tea, alcohol.   He is limiting snacks.       History of abscess Allergies: Patient has no allergy information on record. Current Outpatient Medications on File Prior to Visit  Medication Sig Dispense Refill   ketotifen (ZADITOR) 0.025 % ophthalmic solution Place 1 drop into both eyes 2 (two) times daily as needed (allergies).     levothyroxine (SYNTHROID) 75 MCG tablet Take 1 tablet (75 mcg total) by mouth  daily. 90 tablet 3   pantoprazole (PROTONIX) 20 MG tablet Take 20 mg by mouth daily.     Vitamin D, Ergocalciferol, (DRISDOL) 1.25 MG (50000 UNIT) CAPS capsule Take 1 capsule (50,000 Units total) by mouth every 7 (seven) days. 12 capsule 3   [DISCONTINUED] fluticasone (FLONASE) 50 MCG/ACT nasal spray Place into both nostrils daily as needed for allergies or rhinitis.     [DISCONTINUED] loratadine (CLARITIN) 10 MG tablet Take 10 mg by mouth daily as needed for allergies.     No current facility-administered medications on file prior to visit.    Review of Systems  Constitutional:  Negative for chills and fever.  Respiratory:  Negative for cough.   Cardiovascular:  Negative for chest pain and palpitations.  Gastrointestinal:  Negative for nausea and vomiting.  Skin:  Positive for wound. Negative for rash.      Objective:    BP 118/80   Pulse (!) 56   Temp 97.9 F (36.6 C) (Oral)   Ht 5\' 6"  (1.676 m)   Wt 234 lb 3.2 oz (106.2 kg)   SpO2 97%   BMI 37.80 kg/m   BP Readings from Last 3 Encounters:  10/01/22 118/80  08/16/22 120/80  05/13/22 128/78   Wt Readings from Last 3 Encounters:  10/01/22 234 lb 3.2 oz (106.2 kg)  08/16/22 239 lb (108.4 kg)  05/13/22 237 lb (107.5 kg)    Physical Exam  Vitals reviewed.  Constitutional:      Appearance: He is well-developed.  Cardiovascular:     Rate and Rhythm: Regular rhythm.     Heart sounds: Normal heart sounds.  Pulmonary:     Effort: Pulmonary effort is normal. No respiratory distress.     Breath sounds: Normal breath sounds. No wheezing, rhonchi or rales.  Skin:    General: Skin is warm and dry.          Comments: Approx 2 cm nodules felt bilateral medial thigh.  No rash, purulent discharge.  No increased warmth.  Area nonfluctuant. Skin is intact  Neurological:     Mental Status: He is alert.  Psychiatric:        Speech: Speech normal.        Behavior: Behavior normal.

## 2022-11-20 ENCOUNTER — Encounter: Payer: Self-pay | Admitting: Family

## 2022-11-21 ENCOUNTER — Other Ambulatory Visit: Payer: Self-pay | Admitting: Family

## 2022-11-21 DIAGNOSIS — L0291 Cutaneous abscess, unspecified: Secondary | ICD-10-CM

## 2022-11-21 MED ORDER — CEPHALEXIN 500 MG PO CAPS: 500.0000 mg | ORAL_CAPSULE | Freq: Four times a day (QID) | ORAL | 0 refills | Status: DC

## 2023-02-01 ENCOUNTER — Other Ambulatory Visit: Payer: Self-pay | Admitting: Internal Medicine

## 2023-03-31 ENCOUNTER — Ambulatory Visit: Payer: BC Managed Care – PPO | Admitting: Internal Medicine

## 2023-03-31 ENCOUNTER — Encounter: Payer: Self-pay | Admitting: Internal Medicine

## 2023-03-31 ENCOUNTER — Ambulatory Visit: Payer: Self-pay | Admitting: Family

## 2023-03-31 VITALS — BP 118/76 | HR 62 | Temp 98.2°F | Ht 66.0 in | Wt 240.0 lb

## 2023-03-31 DIAGNOSIS — R509 Fever, unspecified: Secondary | ICD-10-CM | POA: Diagnosis not present

## 2023-03-31 DIAGNOSIS — J069 Acute upper respiratory infection, unspecified: Secondary | ICD-10-CM | POA: Diagnosis not present

## 2023-03-31 DIAGNOSIS — J029 Acute pharyngitis, unspecified: Secondary | ICD-10-CM

## 2023-03-31 LAB — POCT INFLUENZA A/B
Influenza A, POC: NEGATIVE
Influenza B, POC: NEGATIVE

## 2023-03-31 LAB — POCT RAPID STREP A (OFFICE): Rapid Strep A Screen: NEGATIVE

## 2023-03-31 LAB — POC COVID19 BINAXNOW: SARS Coronavirus 2 Ag: NEGATIVE

## 2023-03-31 NOTE — Patient Instructions (Addendum)
-  Was a pleasure meeting you today -I suspect you have an underlying viral infection causing your fevers. -Your symptoms appear to be already improving.   -Consider warm salt water gargles to help with your symptoms -Continue with ibuprofen  as needed for fevers -If your symptoms worsen acutely or remain persistent please contact us  for further evaluation

## 2023-03-31 NOTE — Assessment & Plan Note (Signed)
-  Patient presents today with intermittent sore throat and fevers beginning yesterday.  Patient states that his fever had a Tmax of 102.1 F yesterday and today his Tmax was 100 F.  Complains of intermittent sore throat and some swollen cervical lymph nodes. -On exam, he had no tender cervical lymphadenopathy.  No pharyngeal or tonsillar exudate.  No sinus tenderness. -Patient was tested for strep and was negative.  Flu and COVID test were negative as well. -I suspect he likely has an underlying viral infection causing his fevers. -Patient has no significant symptoms at this time except for the fevers -No indication for treatment at this time. -Patient informed to let us  know if his symptoms worsen or remain persistent. -No further workup at this time

## 2023-03-31 NOTE — Progress Notes (Signed)
 Acute Office Visit  Subjective:     Patient ID: Gary Pace, male    DOB: 05-Mar-1975, 48 y.o.   MRN: [id number]  Chief Complaint  Patient presents with   Sore Throat    Fever on 03/30/23 of 102.1 - no symptoms Fever on 03/31/23 100.0 and sore throat     Sore Throat  Pertinent negatives include no congestion, coughing, ear pain or shortness of breath.   Patient is in today for follow-up of fevers and sore throat.  Patient states that yesterday he developed fevers up to 102.1 F today had a fever up to 100 F and was sent home from work.  He also complains of associated sore throat intermittently.  He has no difficulty swallowing.  He does note some swelling of his cervical lymph nodes.  He denies any nasal or sinus congestion.  No cough.  No headache.  No shortness of breath.  No wheezing.  His girlfriend was sick last week with 1 episode of nausea and vomiting and 1 episode of diarrhea as well as wants to my patient to hold off for flu AMB testing today  Review of Systems  Constitutional:  Positive for fever. Negative for malaise/fatigue.  HENT:  Positive for sore throat. Negative for congestion, ear pain and sinus pain.   Respiratory: Negative.  Negative for cough, sputum production, shortness of breath and wheezing.         Objective:    BP 118/76   Pulse 62   Temp 98.2 F (36.8 C)   Ht 5\' 6"  (1.676 m)   Wt 240 lb (108.9 kg)   SpO2 98%   BMI 38.74 kg/m    Physical Exam Constitutional:      Appearance: Normal appearance. He is well-developed.  HENT:     Head: Normocephalic and atraumatic.     Right Ear: External ear normal.     Left Ear: External ear normal.     Nose:     Right Sinus: No maxillary sinus tenderness or frontal sinus tenderness.     Left Sinus: No maxillary sinus tenderness or frontal sinus tenderness.     Mouth/Throat:     Mouth: Mucous membranes are moist.     Pharynx: Oropharynx is clear. No oropharyngeal exudate or posterior oropharyngeal  erythema.     Tonsils: No tonsillar exudate or tonsillar abscesses.  Cardiovascular:     Rate and Rhythm: Normal rate and regular rhythm.     Heart sounds: Normal heart sounds.  Pulmonary:     Effort: Pulmonary effort is normal.     Breath sounds: Normal breath sounds. No wheezing, rhonchi or rales.  Neurological:     Mental Status: He is alert.  Psychiatric:        Mood and Affect: Mood normal.        Behavior: Behavior normal.     No results found for any visits on 03/31/23.      Assessment & Plan:   Problem List Items Addressed This Visit       Respiratory   URI (upper respiratory infection) - Primary   -Patient presents today with intermittent sore throat and fevers beginning yesterday.  Patient states that his fever had a Tmax of 102.1 F yesterday and today his Tmax was 100 F.  Complains of intermittent sore throat and some swollen cervical lymph nodes. -On exam, he had no tender cervical lymphadenopathy.  No pharyngeal or tonsillar exudate.  No sinus tenderness. -Patient was tested for strep  and was negative.  Flu and COVID test were negative as well. -I suspect he likely has an underlying viral infection causing his fevers. -Patient has no significant symptoms at this time except for the fevers -No indication for treatment at this time. -Patient informed to let us  know if his symptoms worsen or remain persistent. -No further workup at this time       No orders of the defined types were placed in this encounter.   No follow-ups on file.  Aadin Gaut, MD

## 2023-03-31 NOTE — Telephone Encounter (Signed)
  Chief Complaint: fever, sore throat Symptoms: fever, intermittent sore throat Frequency: x 2 days Pertinent Negatives: Patient denies cough, runny nose, red eyes, rash, difficulty breathing, body aches, chills, vomiting, diarrhea, tongue or facial swelling. Disposition: [] ED /[] Urgent Care (no appt availability in office) / [x] Appointment(In office/virtual)/ []  [facility]/ [] Home Care/ [] Refused Recommended Disposition /[] [facility]/ []  Follow-up with PCP Additional Notes: Patient states he has been taking ibuprofen  for the fever and last dose was 11am. Patient states he had to leave work early due to fever. He states he has been febrile for 2 days and sore throat started today. He states he has had a tonsillectomy. Patient requesting in office visit to have testing done.    Reason for Disposition  [1] Sore throat is the only symptom AND [2] present > 48 hours  Answer Assessment - Initial Assessment Questions 1. ONSET: "When did the throat start hurting?" (Hours or days ago)      Today.  2. SEVERITY: "How bad is the sore throat?" (Scale 1-10; mild, moderate or severe)   - MILD (1-3):  Doesn't interfere with eating or normal activities.   - MODERATE (4-7): Interferes with eating some solids and normal activities.   - SEVERE (8-10):  Excruciating pain, interferes with most normal activities.   - SEVERE WITH DYSPHAGIA (10): Can't swallow liquids, drooling.     3-4/10  3. STREP EXPOSURE: "Has there been any exposure to strep within the past week?" If Yes, ask: "What type of contact occurred?"      Denies known exposure to strep. He states last week his girlfriend was sick with vomiting, diarrhea and fever.   4.  VIRAL SYMPTOMS: "Are there any symptoms of a cold, such as a runny nose, cough, hoarse voice or red eyes?"      Denies.  5. FEVER: "Do you have a fever?" If Yes, ask: "What is your temperature, how was it measured, and when did it start?"     Fever  yesterday was 102.1 and today was 100 with oral thermometer and at work with a infrared temperature gun.  6. PUS ON THE TONSILS: "Is there pus on the tonsils in the back of your throat?"     Tonsils have been removed, denies any redness or white spots in back of throat.  7. OTHER SYMPTOMS: "Do you have any other symptoms?" (e.g., difficulty breathing, headache, rash)     Headache when the fever comes on.  Protocols used: Sore Throat-A-AH

## 2023-03-31 NOTE — Addendum Note (Signed)
 Addended by: Reynolds Cea A on: 03/31/2023 02:43 PM   Modules accepted: Orders

## 2023-03-31 NOTE — Telephone Encounter (Addendum)
 1st attempt, left voicemail for patient to return call for triage.  Summary: Fever Yesterday/Sore Throat   Copied From CRM [id number]. Reason for Triage: Patient sent home early from work today. Temperature 102.1 yesterday and he has been taking ibuprofen . Patient has low grade fever today 100 and his throat is a little sore and he said he can feel glands on the side of this throat

## 2023-04-10 LAB — LAB REPORT - SCANNED
A1c: 6.1
Calcium: 9.4
EGFR: 84

## 2023-05-14 ENCOUNTER — Other Ambulatory Visit: Payer: Self-pay | Admitting: Internal Medicine

## 2023-05-16 ENCOUNTER — Encounter: Payer: Self-pay | Admitting: Family

## 2023-05-16 ENCOUNTER — Ambulatory Visit: Payer: BC Managed Care – PPO | Admitting: Family

## 2023-05-16 ENCOUNTER — Encounter: Payer: BC Managed Care – PPO | Admitting: Family

## 2023-05-16 VITALS — BP 122/82 | HR 74 | Temp 97.7°F | Ht 66.0 in | Wt 225.8 lb

## 2023-05-16 DIAGNOSIS — Z0000 Encounter for general adult medical examination without abnormal findings: Secondary | ICD-10-CM

## 2023-05-16 DIAGNOSIS — Z0001 Encounter for general adult medical examination with abnormal findings: Secondary | ICD-10-CM | POA: Diagnosis not present

## 2023-05-16 DIAGNOSIS — N50811 Right testicular pain: Secondary | ICD-10-CM

## 2023-05-16 DIAGNOSIS — R899 Unspecified abnormal finding in specimens from other organs, systems and tissues: Secondary | ICD-10-CM

## 2023-05-16 LAB — COMPREHENSIVE METABOLIC PANEL WITH GFR
ALT: 20 U/L (ref 0–53)
AST: 23 U/L (ref 0–37)
Albumin: 4.4 g/dL (ref 3.5–5.2)
Alkaline Phosphatase: 46 U/L (ref 39–117)
BUN: 16 mg/dL (ref 6–23)
CO2: 29 meq/L (ref 19–32)
Calcium: 9.5 mg/dL (ref 8.4–10.5)
Chloride: 104 meq/L (ref 96–112)
Creatinine, Ser: 1.03 mg/dL (ref 0.40–1.50)
GFR: 86.14 mL/min (ref >60.00)
Glucose, Bld: 81 mg/dL (ref 70–99)
Potassium: 3.9 meq/L (ref 3.5–5.1)
Sodium: 139 meq/L (ref 135–145)
Total Bilirubin: 0.7 mg/dL (ref 0.2–1.2)
Total Protein: 7.6 g/dL (ref 6.0–8.3)

## 2023-05-16 LAB — CBC WITH DIFFERENTIAL/PLATELET
Basophils Absolute: 0 10*3/uL (ref 0.0–0.1)
Basophils Relative: 0.6 % (ref 0.0–3.0)
Eosinophils Absolute: 0.4 10*3/uL (ref 0.0–0.7)
Eosinophils Relative: 5.3 % — ABNORMAL HIGH (ref 0.0–5.0)
HCT: 42.1 % (ref 39.0–52.0)
Hemoglobin: 14.1 g/dL (ref 13.0–17.0)
Lymphocytes Relative: 38.2 % (ref 12.0–46.0)
Lymphs Abs: 2.8 10*3/uL (ref 0.7–4.0)
MCHC: 33.4 g/dL (ref 30.0–36.0)
MCV: 86 fl (ref 78.0–100.0)
Monocytes Absolute: 0.6 10*3/uL (ref 0.1–1.0)
Monocytes Relative: 7.4 % (ref 3.0–12.0)
Neutro Abs: 3.6 10*3/uL (ref 1.4–7.7)
Neutrophils Relative %: 48.5 % (ref 43.0–77.0)
Platelets: 301 10*3/uL (ref 150.0–400.0)
RBC: 4.9 Mil/uL (ref 4.22–5.81)
RDW: 13.5 % (ref 11.5–15.5)
WBC: 7.5 10*3/uL (ref 4.0–10.5)

## 2023-05-16 NOTE — Assessment & Plan Note (Addendum)
 Reviewed labs obtained through [facility] on his phone. ASCVD risk 4.5% calculated from [facility] labs ; he will send printed copy me of labs.  Encouraged more formal exercise, and limiting processed foods, trans fats, saturated fats.

## 2023-05-16 NOTE — Patient Instructions (Addendum)
 Trial naprosyn for testicular pain, groin pain for the next couple of days with food.   If symptom persists, please let me know right away and I will order ultrasound  Health Maintenance, Male Adopting a healthy lifestyle and getting preventive care are important in promoting health and wellness. Ask your health care provider about: The right schedule for you to have regular tests and exams. Things you can do on your own to prevent diseases and keep yourself healthy. What should I know about diet, weight, and exercise? Eat a healthy diet  Eat a diet that includes plenty of vegetables, fruits, low-fat dairy products, and lean protein. Do not eat a lot of foods that are high in solid fats, added sugars, or sodium. Maintain a healthy weight Body mass index (BMI) is a measurement that can be used to identify possible weight problems. It estimates body fat based on height and weight. Your health care provider can help determine your BMI and help you achieve or maintain a healthy weight. Get regular exercise Get regular exercise. This is one of the most important things you can do for your health. Most adults should: Exercise for at least 150 minutes each week. The exercise should increase your heart rate and make you sweat (moderate-intensity exercise). Do strengthening exercises at least twice a week. This is in addition to the moderate-intensity exercise. Spend less time sitting. Even light physical activity can be beneficial. Watch cholesterol and blood lipids Have your blood tested for lipids and cholesterol at 48 years of age, then have this test every 5 years. You may need to have your cholesterol levels checked more often if: Your lipid or cholesterol levels are high. You are older than 48 years of age. You are at high risk for heart disease. What should I know about cancer screening? Many types of cancers can be detected early and may often be prevented. Depending on your health history  and family history, you may need to have cancer screening at various ages. This may include screening for: Colorectal cancer. Prostate cancer. Skin cancer. Lung cancer. What should I know about heart disease, diabetes, and high blood pressure? Blood pressure and heart disease High blood pressure causes heart disease and increases the risk of stroke. This is more likely to develop in people who have high blood pressure readings or are overweight. Talk with your health care provider about your target blood pressure readings. Have your blood pressure checked: Every 3-5 years if you are 19-66 years of age. Every year if you are 94 years old or older. If you are between the ages of 38 and 59 and are a current or former smoker, ask your health care provider if you should have a one-time screening for abdominal aortic aneurysm (AAA). Diabetes Have regular diabetes screenings. This checks your fasting blood sugar level. Have the screening done: Once every three years after age 40 if you are at a normal weight and have a low risk for diabetes. More often and at a younger age if you are overweight or have a high risk for diabetes. What should I know about preventing infection? Hepatitis B If you have a higher risk for hepatitis B, you should be screened for this virus. Talk with your health care provider to find out if you are at risk for hepatitis B infection. Hepatitis C Blood testing is recommended for: Everyone born from [birth year] through 1965. Anyone with known risk factors for hepatitis C. Sexually transmitted infections (STIs) You should be  screened each year for STIs, including gonorrhea and chlamydia, if: You are sexually active and are younger than 48 years of age. You are older than 48 years of age and your health care provider tells you that you are at risk for this type of infection. Your sexual activity has changed since you were last screened, and you are at increased risk for chlamydia  or gonorrhea. Ask your health care provider if you are at risk. Ask your health care provider about whether you are at high risk for HIV. Your health care provider may recommend a prescription medicine to help prevent HIV infection. If you choose to take medicine to prevent HIV, you should first get tested for HIV. You should then be tested every 3 months for as long as you are taking the medicine. Follow these instructions at home: Alcohol use Do not drink alcohol if your health care provider tells you not to drink. If you drink alcohol: Limit how much you have to 0-2 drinks a day. Know how much alcohol is in your drink. In the [place] one drink equals one 12 oz bottle of beer (355 mL), one 5 oz glass of wine (148 mL), or one 1 oz glass of hard liquor (44 mL). Lifestyle Do not use any products that contain nicotine or tobacco. These products include cigarettes, chewing tobacco, and vaping devices, such as e-cigarettes. If you need help quitting, ask your health care provider. Do not use street drugs. Do not share needles. Ask your health care provider for help if you need support or information about quitting drugs. General instructions Schedule regular health, dental, and eye exams. Stay current with your vaccines. Tell your health care provider if: You often feel depressed. You have ever been abused or do not feel safe at home. Summary Adopting a healthy lifestyle and getting preventive care are important in promoting health and wellness. Follow your health care provider's instructions about healthy diet, exercising, and getting tested or screened for diseases. Follow your health care provider's instructions on monitoring your cholesterol and blood pressure. This information is not intended to replace advice given to you by your health care provider. Make sure you discuss any questions you have with your health care provider. Document Revised: 06/26/2020 Document Reviewed:  06/26/2020 Elsevier Patient Education  2024 [employer].

## 2023-05-16 NOTE — Progress Notes (Signed)
 Assessment & Plan:  Routine physical examination Assessment & Plan: Reviewed labs obtained through [facility] on his phone. ASCVD risk 4.5% calculated from [facility] labs ; he will send printed copy me of labs.  Encouraged more formal exercise, and limiting processed foods, trans fats, saturated fats.  Orders: -     CBC with Differential/Platelet -     Comprehensive metabolic panel with GFR  Pain in right testicle Assessment & Plan: Reassuring testicular exam. Symptom can be bilateral , more often right testicle. In the absence of acute onset, unilateral testicular pain, testicular swelling, epididymitis would be less likely.  Question if muscular strain. Advised rest, NSAIDs.  If pain persist most certainly worsens, we jointly agreed to pursue ultrasound, urine. Patient is comfortable with monitoring at this time.       Return precautions given.   Risks, benefits, and alternatives of the medications and treatment plan prescribed today were discussed, and patient expressed understanding.   Education regarding symptom management and diagnosis given to patient on AVS either electronically or printed.  Return in about 6 months (around 11/16/2023).  Rennie Plowman, FNP  Subjective:    Patient ID: Gary Pace, male    DOB: 03/30/1975, 48 y.o.   MRN: [id number]  CC: Gary Pace is a 48 y.o. male who presents today for physical exam.    HPI: Complain of right testicular pain past couple of days.    Episodic when steps laterally with right leg. Less frequently, he has similar  left testicle pain when stepping with the right foot.   No testicular pain with stepping with left foot or while sitting.   No testicular swelling, testicular pain with sitting, testicular pain with having BM.  No abdominal pain, constipation, dysuria, decreased urination, back pain, unusual penile discharge.     Ho prediabetes   Following with endocrinology for left thyroid nodule, s/p  lobectomy Compliant with synthroid  Colorectal  Cancer Screening: UTD , 12/14/20  Immunizations       Tetanus - UTD         Exercise: No formal exercise.  He is working on [employer].   Alcohol use:  none Smoking/tobacco use: Nonsmoker.    Health Maintenance  Topic Date Due   Flu Shot  09/19/2023   Colon Cancer Screening  12/15/2030   DTaP/Tdap/Td vaccine (3 - Td or Tdap) 09/30/2032   Hepatitis C Screening  Completed   HIV Screening  Completed   HPV Vaccine  Aged Out   COVID-19 Vaccine  Discontinued     ALLERGIES: Patient has no allergy information on record.  Current Outpatient Medications on File Prior to Visit  Medication Sig Dispense Refill   ketotifen (ZADITOR) 0.025 % ophthalmic solution Place 1 drop into both eyes 2 (two) times daily as needed (allergies).     levothyroxine (SYNTHROID) 75 MCG tablet TAKE 1 TABLET(75 MCG) BY MOUTH DAILY 90 tablet 1   pantoprazole (PROTONIX) 20 MG tablet Take 20 mg by mouth daily.     Vitamin D, Ergocalciferol, (DRISDOL) 1.25 MG (50000 UNIT) CAPS capsule TAKE 1 CAPSULE BY MOUTH EVERY 7 DAYS 13 capsule 0   [DISCONTINUED] fluticasone (FLONASE) 50 MCG/ACT nasal spray Place into both nostrils daily as needed for allergies or rhinitis.     [DISCONTINUED] loratadine (CLARITIN) 10 MG tablet Take 10 mg by mouth daily as needed for allergies.     No current facility-administered medications on file prior to visit.    Review of Systems  Constitutional:  Negative for chills and fever.  Respiratory:  Negative for cough.   Cardiovascular:  Negative for chest pain and palpitations.  Gastrointestinal:  Negative for nausea and vomiting.  Genitourinary:  Positive for testicular pain. Negative for difficulty urinating, genital sores, scrotal swelling and urgency.  Musculoskeletal:  Negative for back pain.      Objective:    BP 122/82   Pulse 74   Temp 97.7 F (36.5 C) (Oral)   Ht 5\' 6"  (1.676 m)   Wt 225 lb 12.8 oz (102.4 kg)    SpO2 98%   BMI 36.45 kg/m   BP Readings from Last 3 Encounters:  05/16/23 122/82  03/31/23 118/76  10/01/22 118/80   Wt Readings from Last 3 Encounters:  05/16/23 225 lb 12.8 oz (102.4 kg)  03/31/23 240 lb (108.9 kg)  10/01/22 234 lb 3.2 oz (106.2 kg)    Physical Exam Vitals reviewed.  Constitutional:      Appearance: He is well-developed.  Neck:     Thyroid: No thyroid mass or thyromegaly.  Cardiovascular:     Rate and Rhythm: Regular rhythm.     Heart sounds: Normal heart sounds.  Pulmonary:     Effort: Pulmonary effort is normal. No respiratory distress.     Breath sounds: Normal breath sounds. No wheezing, rhonchi or rales.  Abdominal:     Hernia: There is no hernia in the left inguinal area or right inguinal area.  Genitourinary:    Testes:        Right: Mass, tenderness or swelling not present. Cremasteric reflex is present.         Left: Mass, tenderness or swelling not present. Cremasteric reflex is present.   Lymphadenopathy:     Head:     Right side of head: No submental, submandibular, tonsillar, preauricular, posterior auricular or occipital adenopathy.     Left side of head: No submental, submandibular, tonsillar, preauricular, posterior auricular or occipital adenopathy.     Cervical: No cervical adenopathy.  Skin:    General: Skin is warm and dry.  Neurological:     Mental Status: He is alert.  Psychiatric:        Speech: Speech normal.        Behavior: Behavior normal.

## 2023-05-20 ENCOUNTER — Telehealth: Payer: Self-pay

## 2023-05-20 ENCOUNTER — Encounter: Payer: Self-pay | Admitting: Family

## 2023-05-20 DIAGNOSIS — N50819 Testicular pain, unspecified: Secondary | ICD-10-CM | POA: Insufficient documentation

## 2023-05-20 NOTE — Telephone Encounter (Signed)
 LVM to call back to inform pt of the message below

## 2023-05-20 NOTE — Addendum Note (Signed)
 Addended by: [place], Kaleb Sek on: 05/20/2023 02:34 PM   Modules accepted: Orders

## 2023-05-20 NOTE — Assessment & Plan Note (Signed)
 Reassuring testicular exam. Symptom can be bilateral , more often right testicle. In the absence of acute onset, unilateral testicular pain, testicular swelling, epididymitis would be less likely.  Question if muscular strain. Advised rest, NSAIDs.  If pain persist most certainly worsens, we jointly agreed to pursue ultrasound, urine. Patient is comfortable with monitoring at this time.

## 2023-05-20 NOTE — Telephone Encounter (Signed)
-----   Message from Rennie Plowman sent at 05/20/2023  7:30 AM EDT ----- Call pt Please ask that he either send through mychart or drop off to front desk labs done through [facility] so I may review /scan to chart

## 2023-05-21 NOTE — Telephone Encounter (Signed)
 Spoke to pt and he stated that he will send  labs via my chart for [place] to review

## 2023-05-27 ENCOUNTER — Telehealth: Payer: Self-pay | Admitting: Family

## 2023-05-27 NOTE — Telephone Encounter (Signed)
 Lab results for this pt from [facility] were dropped for PCP's review. They're up front in [employer]. [facility]

## 2023-05-27 NOTE — Telephone Encounter (Signed)
Placed in providers folder for review.

## 2023-07-07 ENCOUNTER — Other Ambulatory Visit

## 2023-07-07 DIAGNOSIS — R899 Unspecified abnormal finding in specimens from other organs, systems and tissues: Secondary | ICD-10-CM | POA: Diagnosis not present

## 2023-07-07 LAB — CBC WITH DIFFERENTIAL/PLATELET
Basophils Absolute: 0 10*3/uL (ref 0.0–0.1)
Basophils Relative: 0.3 % (ref 0.0–3.0)
Eosinophils Absolute: 0.6 10*3/uL (ref 0.0–0.7)
Eosinophils Relative: 7.3 % — ABNORMAL HIGH (ref 0.0–5.0)
HCT: 40.4 % (ref 39.0–52.0)
Hemoglobin: 13.6 g/dL (ref 13.0–17.0)
Lymphocytes Relative: 34.7 % (ref 12.0–46.0)
Lymphs Abs: 3 10*3/uL (ref 0.7–4.0)
MCHC: 33.6 g/dL (ref 30.0–36.0)
MCV: 84.1 fl (ref 78.0–100.0)
Monocytes Absolute: 0.5 10*3/uL (ref 0.1–1.0)
Monocytes Relative: 6.1 % (ref 3.0–12.0)
Neutro Abs: 4.4 10*3/uL (ref 1.4–7.7)
Neutrophils Relative %: 51.6 % (ref 43.0–77.0)
Platelets: 277 10*3/uL (ref 150.0–400.0)
RBC: 4.81 Mil/uL (ref 4.22–5.81)
RDW: 13.1 % (ref 11.5–15.5)
WBC: 8.6 10*3/uL (ref 4.0–10.5)

## 2023-07-11 ENCOUNTER — Ambulatory Visit: Payer: Self-pay | Admitting: Family

## 2023-07-30 ENCOUNTER — Telehealth: Payer: Self-pay | Admitting: Internal Medicine

## 2023-07-30 NOTE — Telephone Encounter (Signed)
 Patient is calling to say that he would like to have labs done as soon as possible prior to his 08/06/2023 visit with Dr. Rosalea Collin.  Patient would like lab orders sent to   [facility] in: [facility] Address: [address] [place], [place] [address] Phone: [phone] Fax #  [phone]

## 2023-07-30 NOTE — Telephone Encounter (Signed)
 Patient is aware that Dr. Rosalea Collin is out of office today and message will be sent tomorrow to request labs prior to appt

## 2023-07-31 ENCOUNTER — Other Ambulatory Visit: Payer: Self-pay | Admitting: Internal Medicine

## 2023-07-31 ENCOUNTER — Other Ambulatory Visit: Payer: Self-pay

## 2023-07-31 DIAGNOSIS — E559 Vitamin D deficiency, unspecified: Secondary | ICD-10-CM

## 2023-07-31 DIAGNOSIS — E890 Postprocedural hypothyroidism: Secondary | ICD-10-CM | POA: Diagnosis not present

## 2023-08-01 LAB — TSH: TSH: 1.49 u[IU]/mL (ref 0.450–4.500)

## 2023-08-01 LAB — VITAMIN D 25 HYDROXY (VIT D DEFICIENCY, FRACTURES): Vit D, 25-Hydroxy: 53.9 ng/mL (ref 30.0–100.0)

## 2023-08-04 ENCOUNTER — Ambulatory Visit: Payer: BC Managed Care – PPO | Admitting: Internal Medicine

## 2023-08-04 ENCOUNTER — Encounter: Payer: Self-pay | Admitting: Internal Medicine

## 2023-08-04 VITALS — BP 116/72 | HR 52 | Ht 66.0 in | Wt 222.0 lb

## 2023-08-04 DIAGNOSIS — E559 Vitamin D deficiency, unspecified: Secondary | ICD-10-CM

## 2023-08-04 DIAGNOSIS — E890 Postprocedural hypothyroidism: Secondary | ICD-10-CM | POA: Diagnosis not present

## 2023-08-04 MED ORDER — VITAMIN D (ERGOCALCIFEROL) 1.25 MG (50000 UNIT) PO CAPS: 50000.0000 [IU] | ORAL_CAPSULE | ORAL | 3 refills | Status: DC

## 2023-08-04 MED ORDER — LEVOTHYROXINE SODIUM 75 MCG PO TABS: 75.0000 ug | ORAL_TABLET | Freq: Every day | ORAL | 3 refills | Status: AC

## 2023-08-04 NOTE — Progress Notes (Signed)
 `  Name: Gary Pace  MRN/ DOB: [id number], [birth year]    Age/ Sex: 48 y.o., male     PCP: Gary Catching, FNP   Reason for Endocrinology Evaluation: Left thyroid  nodule     Initial Endocrinology Clinic Visit: 09/11/2020    PATIENT IDENTIFIER: [name] is a 48 y.o., male with a past medical history of Prediabetes, and GERD. He has followed with [facility] since 09/11/2020 for consultative assistance with management of his left thyroid  nodule .   HISTORICAL SUMMARY:  He has been noted with thyromegaly during physical exam which prompted a thyroid  ultrasound demonstrating a 5.1 cm left thyroid  nodule, meeting FNA criteria. 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 Aram Pace   GERD (gastroesophageal reflux disease)    Past Surgical History:  Past Surgical History:  Procedure Laterality Date   CARPAL TUNNEL RELEASE Right    [facility]   COLONOSCOPY WITH PROPOFOL  N/A 12/14/2020   Procedure: COLONOSCOPY WITH PROPOFOL ;  Surgeon: Gary Daily, MD;  Location: [facility];  Service: Endoscopy;  Laterality: N/A;   THYROID  LOBECTOMY Left 03/09/2021   Procedure: LEFT THYROID  LOBECTOMY;  Surgeon: Gary Billow, MD;  Location: [facility];  Service: General;  Laterality: Left;    TONSILLECTOMY AND ADENOIDECTOMY  2008   Social History:  reports that he has never smoked. He has never used smokeless tobacco. He reports that he does not drink alcohol and does not use drugs. Family History:  Family History  Problem Relation Age of Onset   Thyroid  disease Brother    Cancer Paternal Grandfather 60       prostate cancer   Stroke Paternal Grandfather    Diabetes Paternal Grandfather    Other Paternal Grandfather    Heart attack Paternal Grandfather    Cancer Paternal Aunt        breast, brain   Other Mother        unknown medical history   Other Father        unknown medical history   AAA (abdominal aortic aneurysm) Paternal Aunt    Thyroid  cancer Neg Hx      HOME MEDICATIONS: Allergies as of 08/04/2023   Not on File      Medication List        Accurate as of August 04, 2023  3:11 PM. If you have any questions, ask your nurse or doctor.          ketotifen 0.025 % ophthalmic solution Commonly known as: ZADITOR Place 1 drop into both eyes 2 (two) times Pace as needed (allergies).   levothyroxine  75 MCG tablet Commonly known as: SYNTHROID  Take 1 tablet (75 mcg total) by mouth Pace before breakfast. What changed: See the  new instructions. Changed by: Gary Pace   pantoprazole  20 MG tablet Commonly known as: PROTONIX  Take 20 mg by mouth Pace.   Vitamin D  (Ergocalciferol ) 1.25 MG (50000 UNIT) Caps capsule Commonly known as: DRISDOL  Take 1 capsule (50,000 Units total) by mouth every 14 (fourteen) days. What changed: when to take this Changed by: Gary Pace          OBJECTIVE:   PHYSICAL EXAM: VS: BP 116/72 (BP Location: Left Arm, Patient Position: Sitting, Cuff Size: Normal)   Pulse (!) 52   Ht 5' 6 (1.676 m)   Wt 222 lb (100.7 kg)   SpO2 97%   BMI 35.83 kg/m    EXAM: General: Pt appears well and is in NAD  Neck: General: Supple without adenopathy. Thyroid : no nodules or goiter appreciated   Lungs: Clear  with good BS bilat   Heart: Auscultation: RRR.  Abdomen: soft, nontender  Extremities:  BL LE: No pretibial edema   Mental Status: Judgment, insight: Intact Orientation: Oriented to time, place, and person Mood and affect: No depression, anxiety, or agitation     DATA REVIEWED:   Latest Reference Range & Units 07/31/23 13:12  Vitamin D , 25-Hydroxy 30.0 - 100.0 ng/mL 53.9    Latest Reference Range & Units 07/31/23 13:12  TSH 0.450 - 4.500 uIU/mL 1.490    Pathology report 03/09/2021   Clinical History: Left thyroid  nodule (c[facility])      FINAL MICROSCOPIC DIAGNOSIS:   A. THYROID , LEFT, LOBECTOMY (31.0 G):  Multinodular goiter with dominant nodule  Negative for carcinoma     FNA left thyroid  nodule 09/20/2020  A. THYROID , LEFT; ULTRASOUND-GUIDED FINE NEEDLE ASPIRATION:  - BENIGN (BETHESDA DIAGNOSTIC CATEGORY II).  - BENIGN FOLLICULAR EPITHELIAL CELLS AND COLLOID.   ASSESSMENT / PLAN / RECOMMENDATIONS:   Left thyroid  Nodule , S/P Left Lobectomy    - S/P left lobectomy with benign pathology  -TSH normal, no change  Medication  Continue levothyroxine  75 mcg Pace     2.  Vitamin D  deficiency  - Was 16.9 but this has resolved   Continue  ergocalciferol  50,000 IU every other week   Recommend continuation of current Tx with primary MD   Signed electronically by: Gary Bail, MD  [facility]  [facility] [address] [place], [place] [address] Phone: [phone] FAX: [phone]      CC: Gary Catching, FNP [address] [place] [address] Phone: [phone]  Fax: [phone]   Return to Endocrinology clinic as below: Future Appointments  Date Time Provider Department Center  11/17/2023 12:00 PM Arnett, Hanley Lew, FNP [id number]

## 2023-08-04 NOTE — Patient Instructions (Signed)

## 2023-08-26 ENCOUNTER — Telehealth

## 2023-08-26 DIAGNOSIS — L0292 Furuncle, unspecified: Secondary | ICD-10-CM

## 2023-08-27 MED ORDER — CEPHALEXIN 500 MG PO CAPS: 500.0000 mg | ORAL_CAPSULE | Freq: Four times a day (QID) | ORAL | 0 refills | Status: DC

## 2023-08-27 NOTE — Progress Notes (Signed)

## 2023-11-17 ENCOUNTER — Ambulatory Visit: Admitting: Family

## 2023-11-18 ENCOUNTER — Ambulatory Visit: Admitting: Family

## 2023-11-18 ENCOUNTER — Encounter: Payer: Self-pay | Admitting: Family

## 2023-11-18 VITALS — BP 118/70 | HR 95 | Temp 97.9°F | Ht 66.0 in | Wt 221.2 lb

## 2023-11-18 DIAGNOSIS — K219 Gastro-esophageal reflux disease without esophagitis: Secondary | ICD-10-CM | POA: Diagnosis not present

## 2023-11-18 DIAGNOSIS — N529 Male erectile dysfunction, unspecified: Secondary | ICD-10-CM | POA: Insufficient documentation

## 2023-11-18 DIAGNOSIS — E890 Postprocedural hypothyroidism: Secondary | ICD-10-CM | POA: Diagnosis not present

## 2023-11-18 DIAGNOSIS — R7303 Prediabetes: Secondary | ICD-10-CM | POA: Diagnosis not present

## 2023-11-18 LAB — CBC WITH DIFFERENTIAL/PLATELET
Basophils Absolute: 0 K/uL (ref 0.0–0.1)
Basophils Relative: 0.5 % (ref 0.0–3.0)
Eosinophils Absolute: 0.6 K/uL (ref 0.0–0.7)
Eosinophils Relative: 7.1 % — ABNORMAL HIGH (ref 0.0–5.0)
HCT: 42.2 % (ref 39.0–52.0)
Hemoglobin: 14.3 g/dL (ref 13.0–17.0)
Lymphocytes Relative: 32.1 % (ref 12.0–46.0)
Lymphs Abs: 2.8 K/uL (ref 0.7–4.0)
MCHC: 33.8 g/dL (ref 30.0–36.0)
MCV: 84.5 fl (ref 78.0–100.0)
Monocytes Absolute: 0.7 K/uL (ref 0.1–1.0)
Monocytes Relative: 7.5 % (ref 3.0–12.0)
Neutro Abs: 4.6 K/uL (ref 1.4–7.7)
Neutrophils Relative %: 52.8 % (ref 43.0–77.0)
Platelets: 250 K/uL (ref 150.0–400.0)
RBC: 5 Mil/uL (ref 4.22–5.81)
RDW: 13.4 % (ref 11.5–15.5)
WBC: 8.8 K/uL (ref 4.0–10.5)

## 2023-11-18 LAB — POCT GLYCOSYLATED HEMOGLOBIN (HGB A1C): Hemoglobin A1C: 5.6 % (ref 4.0–5.6)

## 2023-11-18 LAB — URINALYSIS, ROUTINE W REFLEX MICROSCOPIC
Bilirubin Urine: NEGATIVE
Hgb urine dipstick: NEGATIVE
Ketones, ur: NEGATIVE
Leukocytes,Ua: NEGATIVE
Nitrite: NEGATIVE
RBC / HPF: NONE SEEN (ref 0–?)
Specific Gravity, Urine: 1.025 (ref 1.000–1.030)
Total Protein, Urine: NEGATIVE
Urine Glucose: NEGATIVE
Urobilinogen, UA: 0.2 (ref 0.0–1.0)
WBC, UA: NONE SEEN (ref 0–?)
pH: 6 (ref 5.0–8.0)

## 2023-11-18 MED ORDER — PANTOPRAZOLE SODIUM 20 MG PO TBEC: 20.0000 mg | DELAYED_RELEASE_TABLET | Freq: Every day | ORAL | 3 refills | Status: AC

## 2023-11-18 MED ORDER — SILDENAFIL CITRATE 50 MG PO TABS: 25.0000 mg | ORAL_TABLET | Freq: Every day | ORAL | 3 refills | Status: AC | PRN

## 2023-11-18 NOTE — Assessment & Plan Note (Signed)
 Longer following with endocrinology.  I will resume prescription of Synthroid  75 mcg.

## 2023-11-18 NOTE — Patient Instructions (Addendum)
 Let me know if swallowing doesn't improve on protonix ; if not , please let me know as I would advise repeat endoscopy.   Sildenafil  Tablets (Erectile Dysfunction) What is this medication? SILDENAFIL  (sil DEN a fil) treats erectile dysfunction (ED). It works by increasing blood flow to the penis, which helps to maintain an erection. This medicine may be used for other purposes; ask your health care provider or pharmacist if you have questions. COMMON BRAND NAME(S): Viagra  What should I tell my care team before I take this medication? They need to know if you have any of these conditions: Abnormal penis shape or Peyronie disease Bleeding disorders Eye or vision loss problems Heart disease Low blood pressure History of blood diseases, such as sickle cell anemia or leukemia History of painful and prolonged erection Pulmonary veno-occlusive disease (PVOD) Stomach ulcers, other stomach or intestine problems An unusual or allergic reaction to sildenafil , other medications, foods, dyes, or preservatives Pregnant or trying to get pregnant Breastfeeding How should I use this medication? Take this medication by mouth with a glass of water. Follow the directions on the prescription label. The dose is usually taken 1 hour before sexual activity. You should not take the dose more than once per day. Do not take your medication more often than directed. Talk to your care team about the use of this medication in children. This medication is not used in children for this condition. Overdosage: If you think you have taken too much of this medicine contact a poison control center or emergency room at once. NOTE: This medicine is only for you. Do not share this medicine with others. What if I miss a dose? This does not apply. Do not take double or extra doses. What may interact with this medication? Do not take this medication with any of the following: Nitrates, such as amyl nitrite, isosorbide dinitrate,  isosorbide mononitrate, nitroglycerin Riociguat Vericiguat This medication may also interact with the following: Bosentan Certain medications for blood pressure Other medications may affect the way this medication works. Talk with your care team about all of the medications you take. They may suggest changes to your treatment plan to lower the risk of side effects and to make sure your medications work as intended. This list may not describe all possible interactions. Give your health care provider a list of all the medicines, herbs, non-prescription drugs, or dietary supplements you use. Also tell them if you smoke, drink alcohol, or use illegal drugs. Some items may interact with your medicine. What should I watch for while using this medication? Visit your care team for regular checks on your progress. Tell your care team if your symptoms do not start to get better or if they get worse. Tell your care team right away if you have any change in your eyesight or hearing. This medication may affect your coordination, reaction time, or judgment. Do not drive or operate machinery until you know how this medication affects you. Sit up or stand slowly to reduce the risk of dizzy or fainting spells. Drinking alcohol with this medication can increase the risk of these side effects. Contact your care team right away if you have an erection that lasts longer than 4 hours or if it becomes painful. This may be a sign of a serious problem and must be treated right away to prevent permanent damage. If you experience symptoms of nausea, dizziness, chest pain or arm pain upon initiation of sexual activity after taking this medication, you should refrain  from further activity and call your care team as soon as possible. Using this medication does not protect you or your partner against HIV or other sexually transmitted infections (STIs). What side effects may I notice from receiving this medication? Side effects  that you should report to your care team as soon as possible: Allergic reactions--skin rash, itching, hives, swelling of the face, lips, tongue, or throat Hearing loss or ringing in ears Heart attack--pain or tightness in the chest, shoulders, arms, or jaw, nausea, shortness of breath, cold or clammy skin, feeling faint or lightheaded Heart rhythm changes--fast or irregular heartbeat, dizziness, feeling faint or lightheaded, chest pain, trouble breathing Low blood pressure--dizziness, feeling faint or lightheaded, blurry vision New or worsening shortness of breath Prolonged or painful erection Stroke--sudden numbness or weakness of the face, arm, or leg, trouble speaking, confusion, trouble walking, loss of balance or coordination, dizziness, severe headache, change in vision Sudden vision loss in one or both eyes Side effects that usually do not require medical attention (report to your care team if they continue or are bothersome): Facial flushing or redness Headache Nosebleed Runny or stuffy nose Trouble sleeping Upset stomach This list may not describe all possible side effects. Call your doctor for medical advice about side effects. You may report side effects to FDA at 1-800-FDA-1088. Where should I keep my medication? Keep out of reach of children and pets. Store at room temperature between 15 and 30 degrees C (59 and 86 degrees F). Throw away any unused medication after the expiration date. NOTE: This sheet is a summary. It may not cover all possible information. If you have questions about this medicine, talk to your doctor, pharmacist, or health care provider.  2024 Elsevier/Gold Standard (2022-05-03 00:00:00)

## 2023-11-18 NOTE — Assessment & Plan Note (Signed)
 Patient does not take nitrates.  Previously been on Viagra  with effective treatment of erectile dysfunction.  Resume Viagra  25 to 50 mg as needed.

## 2023-11-18 NOTE — Assessment & Plan Note (Signed)
 Presenting with trouble swallowing.  Previous reported history of esophageal stricture.  Advised with resuming Protonix  20 mg daily, if this symptom does not resolve, please let me know right away as I would recommend endoscopy.   requesting endoscopy report to be sent.

## 2023-11-18 NOTE — Progress Notes (Signed)
 Assessment & Plan:  Gastroesophageal reflux disease, unspecified whether esophagitis present Assessment & Plan: Presenting with trouble swallowing.  Previous reported history of esophageal stricture.  Advised with resuming Protonix  20 mg daily, if this symptom does not resolve, please let me know right away as I would recommend endoscopy.   requesting endoscopy report to be sent.  Orders: -     Pantoprazole  Sodium; Take 1 tablet (20 mg total) by mouth daily.  Dispense: 90 tablet; Refill: 3  Erectile dysfunction, unspecified erectile dysfunction type Assessment & Plan: Patient does not take nitrates.  Previously been on Viagra  with effective treatment of erectile dysfunction.  Resume Viagra  25 to 50 mg as needed.  Orders: -     Sildenafil  Citrate; Take 0.5-1 tablets (25-50 mg total) by mouth daily as needed for erectile dysfunction.  Dispense: 10 tablet; Refill: 3 -     CBC with Differential/Platelet -     Urinalysis, Routine w reflex microscopic  Prediabetes -     POCT glycosylated hemoglobin (Hb A1C)  Postoperative hypothyroidism Assessment & Plan: Longer following with endocrinology.  I will resume prescription of Synthroid  75 mcg.      Return precautions given.   Risks, benefits, and alternatives of the medications and treatment plan prescribed today were discussed, and patient expressed understanding.   Education regarding symptom management and diagnosis given to patient on AVS either electronically or printed.  Return in about 6 months (around 05/17/2024).  Rollene Northern, FNP  Subjective:    Patient ID: Gary Pace, male    DOB: 05/30/1975, 48 y.o.   MRN: [id number]  CC: Gary Pace is a 48 y.o. male who presents today for follow up.   HPI: He notes that he is starting to have episodic trouble swallowing with bread, meat.   No blood in stool, weight loss, throat pain.   Reports a h/o endoscopy and dilatation He would like resume viagra  for erectile  dysfunction as needed.  Previous started by Dr. Ike, urology. Denies decreased urine stream, hematuria.     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 compliant with 75mcg synthroid .  He would like me to resume this medication   Last seen by urology 05/09/21 , Dr Francisca and advised to fu prn.  Per note Dr Ike had started him on sildenafil  for ED. He was also on alfuzosin for BPH.   Colonoscopy is UTD  He doesn't take nitrates   Allergies: Patient has no known allergies. Current Outpatient Medications on File Prior to Visit  Medication Sig Dispense Refill   ketotifen (ZADITOR) 0.025 % ophthalmic solution Place 1 drop into both eyes 2 (two) times daily as needed (allergies).     levothyroxine  (SYNTHROID ) 75 MCG tablet Take 1 tablet (75 mcg total) by mouth daily before breakfast. 90 tablet 3   Vitamin D , Ergocalciferol , (DRISDOL ) 1.25 MG (50000 UNIT) CAPS capsule Take 1 capsule (50,000 Units total) by mouth every 14 (fourteen) days. 6 capsule 3   [DISCONTINUED] fluticasone (FLONASE) 50 MCG/ACT nasal spray Place into both nostrils daily as needed for allergies or rhinitis.     [DISCONTINUED] loratadine (CLARITIN) 10 MG tablet Take 10 mg by mouth daily as needed for allergies.     No current facility-administered medications on file prior to visit.    Review of Systems  Constitutional:  Negative for chills and fever.  Respiratory:  Negative for cough.   Cardiovascular:  Negative for  chest pain and palpitations.  Gastrointestinal:  Negative for nausea and vomiting.      Objective:    BP 118/70   Pulse 95   Temp 97.9 F (36.6 C) (Oral)   Ht 5' 6 (1.676 m)   Wt 221 lb 3.2 oz (100.3 kg)   SpO2 98%   BMI 35.70 kg/m  BP Readings from Last 3 Encounters:  11/18/23 118/70  08/04/23 116/72  05/16/23 122/82   Wt Readings from Last 3 Encounters:  11/18/23 221 lb 3.2 oz (100.3 kg)   08/04/23 222 lb (100.7 kg)  05/16/23 225 lb 12.8 oz (102.4 kg)    Physical Exam Vitals reviewed.  Constitutional:      Appearance: He is well-developed.  Cardiovascular:     Rate and Rhythm: Regular rhythm.     Heart sounds: Normal heart sounds.  Pulmonary:     Effort: Pulmonary effort is normal. No respiratory distress.     Breath sounds: Normal breath sounds. No wheezing, rhonchi or rales.  Skin:    General: Skin is warm and dry.  Neurological:     Mental Status: He is alert.  Psychiatric:        Speech: Speech normal.        Behavior: Behavior normal.

## 2023-11-20 ENCOUNTER — Ambulatory Visit: Payer: Self-pay | Admitting: Family

## 2023-11-21 ENCOUNTER — Encounter: Payer: Self-pay | Admitting: *Deleted

## 2023-11-21 NOTE — Progress Notes (Signed)
Records requested via efax

## 2023-12-13 ENCOUNTER — Other Ambulatory Visit: Payer: Self-pay | Admitting: Internal Medicine

## 2024-05-17 ENCOUNTER — Ambulatory Visit: Admitting: Family
